# Patient Record
Sex: Female | Born: 1965
Health system: Southern US, Community
[De-identification: ages and names within clinical notes are randomized; demographics above are authoritative.]

## PROBLEM LIST (undated history)

## (undated) DIAGNOSIS — I714 Abdominal aortic aneurysm, without rupture, unspecified: Secondary | ICD-10-CM

## (undated) DIAGNOSIS — K219 Gastro-esophageal reflux disease without esophagitis: Secondary | ICD-10-CM

## (undated) DIAGNOSIS — I10 Essential (primary) hypertension: Secondary | ICD-10-CM

## (undated) DIAGNOSIS — Z5189 Encounter for other specified aftercare: Secondary | ICD-10-CM

## (undated) DIAGNOSIS — K56609 Unspecified intestinal obstruction, unspecified as to partial versus complete obstruction: Secondary | ICD-10-CM

## (undated) HISTORY — DX: Encounter for other specified aftercare: Z51.89

## (undated) HISTORY — PX: NO PAST SURGERIES: SHX2092

## (undated) HISTORY — PX: ABDOMINAL HYSTERECTOMY: SHX81

---

## 2009-12-01 ENCOUNTER — Encounter: Admission: RE | Admit: 2009-12-01 | Discharge: 2009-12-01 | Payer: Self-pay | Admitting: General Practice

## 2011-06-19 ENCOUNTER — Emergency Department (HOSPITAL_COMMUNITY): Payer: No Typology Code available for payment source

## 2011-06-19 ENCOUNTER — Emergency Department (HOSPITAL_COMMUNITY)
Admission: EM | Admit: 2011-06-19 | Discharge: 2011-06-19 | Disposition: A | Discharge status: Home / Self Care | Payer: No Typology Code available for payment source | Attending: Emergency Medicine | Admitting: Emergency Medicine

## 2011-06-19 DIAGNOSIS — M25579 Pain in unspecified ankle and joints of unspecified foot: Secondary | ICD-10-CM | POA: Insufficient documentation

## 2011-06-19 DIAGNOSIS — S9000XA Contusion of unspecified ankle, initial encounter: Secondary | ICD-10-CM | POA: Insufficient documentation

## 2011-06-19 DIAGNOSIS — Z79899 Other long term (current) drug therapy: Secondary | ICD-10-CM | POA: Insufficient documentation

## 2011-06-19 DIAGNOSIS — S93409A Sprain of unspecified ligament of unspecified ankle, initial encounter: Secondary | ICD-10-CM | POA: Insufficient documentation

## 2011-06-19 DIAGNOSIS — I10 Essential (primary) hypertension: Secondary | ICD-10-CM | POA: Insufficient documentation

## 2012-04-19 DIAGNOSIS — R011 Cardiac murmur, unspecified: Secondary | ICD-10-CM | POA: Insufficient documentation

## 2012-04-19 DIAGNOSIS — Z862 Personal history of diseases of the blood and blood-forming organs and certain disorders involving the immune mechanism: Secondary | ICD-10-CM | POA: Insufficient documentation

## 2012-04-22 DIAGNOSIS — E559 Vitamin D deficiency, unspecified: Secondary | ICD-10-CM | POA: Insufficient documentation

## 2012-05-07 ENCOUNTER — Other Ambulatory Visit: Payer: Self-pay | Admitting: Family Medicine

## 2012-05-07 DIAGNOSIS — Z1231 Encounter for screening mammogram for malignant neoplasm of breast: Secondary | ICD-10-CM

## 2012-05-13 ENCOUNTER — Ambulatory Visit
Admission: RE | Admit: 2012-05-13 | Discharge: 2012-05-13 | Disposition: A | Discharge status: Home / Self Care | Payer: 59 | Source: Ambulatory Visit | Attending: Family Medicine | Admitting: Family Medicine

## 2012-05-13 DIAGNOSIS — Z1231 Encounter for screening mammogram for malignant neoplasm of breast: Secondary | ICD-10-CM

## 2013-03-21 ENCOUNTER — Telehealth: Payer: Self-pay | Admitting: Hematology and Oncology

## 2013-03-21 NOTE — Telephone Encounter (Signed)
LVOM FOR PT TO RETURN CALL IN RE NP APPT.  °

## 2013-12-18 ENCOUNTER — Encounter (HOSPITAL_COMMUNITY): Payer: Self-pay | Admitting: Pharmacist

## 2013-12-22 ENCOUNTER — Other Ambulatory Visit: Payer: Self-pay | Admitting: Obstetrics & Gynecology

## 2013-12-30 ENCOUNTER — Other Ambulatory Visit: Payer: Self-pay

## 2013-12-30 ENCOUNTER — Encounter (HOSPITAL_COMMUNITY): Payer: Self-pay

## 2013-12-30 ENCOUNTER — Encounter (HOSPITAL_COMMUNITY)
Admission: RE | Admit: 2013-12-30 | Discharge: 2013-12-30 | Disposition: A | Discharge status: Home / Self Care | Payer: 59 | Source: Ambulatory Visit | Attending: Obstetrics & Gynecology | Admitting: Obstetrics & Gynecology

## 2013-12-30 HISTORY — DX: Essential (primary) hypertension: I10

## 2013-12-30 HISTORY — DX: Gastro-esophageal reflux disease without esophagitis: K21.9

## 2013-12-30 LAB — BASIC METABOLIC PANEL
BUN: 12 mg/dL (ref 6–23)
CHLORIDE: 100 meq/L (ref 96–112)
CO2: 29 meq/L (ref 19–32)
CREATININE: 0.85 mg/dL (ref 0.50–1.10)
Calcium: 10.4 mg/dL (ref 8.4–10.5)
GFR, EST NON AFRICAN AMERICAN: 80 mL/min — AB (ref >90)
GLUCOSE: 96 mg/dL (ref 70–99)
POTASSIUM: 3.3 meq/L — AB (ref 3.7–5.3)
Sodium: 141 mEq/L (ref 137–147)

## 2013-12-30 LAB — CBC
HEMATOCRIT: 34.4 % — AB (ref 36.0–46.0)
HEMOGLOBIN: 11.6 g/dL — AB (ref 12.0–15.0)
MCH: 26.1 pg (ref 26.0–34.0)
MCHC: 33.7 g/dL (ref 30.0–36.0)
MCV: 77.3 fL — ABNORMAL LOW (ref 78.0–100.0)
Platelets: 266 10*3/uL (ref 150–400)
RBC: 4.45 MIL/uL (ref 3.87–5.11)
RDW: 14.6 % (ref 11.5–15.5)
WBC: 4.7 10*3/uL (ref 4.0–10.5)

## 2013-12-30 LAB — TYPE AND SCREEN
ABO/RH(D): A POS
Antibody Screen: NEGATIVE

## 2013-12-30 LAB — ABO/RH: ABO/RH(D): A POS

## 2013-12-30 NOTE — Patient Instructions (Addendum)
[place]  12/30/2013   Your procedure is scheduled on:  01/01/14  Enter through the Main Entrance of [facility] up the phone at the desk and dial [phone].   Call this number if you have problems the morning of surgery: [phone]   Remember:   Do not eat food:After Midnight.  Do not drink clear liquids: After Midnight.  Take these medicines the morning of surgery with A SIP OF WATER: Blood pressure medication and Omeprazole   Do not wear jewelry, make-up or nail polish.  Do not wear lotions, powders, or perfumes. You may wear deodorant.  Do not shave 48 hours prior to surgery.  Do not bring valuables to the hospital.  [facility] is not   responsible for any belongings or valuables brought to the hospital.  Contacts, dentures or bridgework may not be worn into surgery.  Leave suitcase in the car. After surgery it may be brought to your room.  For patients admitted to the hospital, checkout time is 11:00 AM the day of              discharge.   Patients discharged the day of surgery will not be allowed to drive             home.  Name and phone number of your driver: NA  Special Instructions:      Please read over the following fact sheets that you were given:   Surgical Site Infection Prevention

## 2014-01-01 ENCOUNTER — Observation Stay (HOSPITAL_COMMUNITY)
Admission: RE | Admit: 2014-01-01 | Discharge: 2014-01-02 | Disposition: A | Discharge status: Home / Self Care | Payer: 59 | Source: Ambulatory Visit | Attending: Obstetrics & Gynecology | Admitting: Obstetrics & Gynecology

## 2014-01-01 ENCOUNTER — Encounter (HOSPITAL_COMMUNITY): Payer: Self-pay | Admitting: Anesthesiology

## 2014-01-01 ENCOUNTER — Encounter (HOSPITAL_COMMUNITY)
Admission: RE | Disposition: A | Discharge status: Home / Self Care | Payer: Self-pay | Source: Ambulatory Visit | Attending: Obstetrics & Gynecology

## 2014-01-01 ENCOUNTER — Ambulatory Visit (HOSPITAL_COMMUNITY): Payer: 59 | Admitting: Anesthesiology

## 2014-01-01 ENCOUNTER — Encounter (HOSPITAL_COMMUNITY): Payer: 59 | Admitting: Anesthesiology

## 2014-01-01 DIAGNOSIS — D252 Subserosal leiomyoma of uterus: Principal | ICD-10-CM | POA: Insufficient documentation

## 2014-01-01 DIAGNOSIS — N949 Unspecified condition associated with female genital organs and menstrual cycle: Secondary | ICD-10-CM | POA: Insufficient documentation

## 2014-01-01 DIAGNOSIS — D649 Anemia, unspecified: Secondary | ICD-10-CM | POA: Insufficient documentation

## 2014-01-01 DIAGNOSIS — Z90710 Acquired absence of both cervix and uterus: Secondary | ICD-10-CM | POA: Diagnosis not present

## 2014-01-01 DIAGNOSIS — I10 Essential (primary) hypertension: Secondary | ICD-10-CM | POA: Insufficient documentation

## 2014-01-01 DIAGNOSIS — K219 Gastro-esophageal reflux disease without esophagitis: Secondary | ICD-10-CM | POA: Insufficient documentation

## 2014-01-01 DIAGNOSIS — N920 Excessive and frequent menstruation with regular cycle: Secondary | ICD-10-CM | POA: Insufficient documentation

## 2014-01-01 DIAGNOSIS — Z9889 Other specified postprocedural states: Secondary | ICD-10-CM

## 2014-01-01 HISTORY — PX: BILATERAL SALPINGECTOMY: SHX5743

## 2014-01-01 HISTORY — PX: ROBOTIC ASSISTED TOTAL HYSTERECTOMY: SHX6085

## 2014-01-01 SURGERY — ROBOTIC ASSISTED TOTAL HYSTERECTOMY
Anesthesia: General | Site: Abdomen

## 2014-01-01 MED ORDER — DEXAMETHASONE SODIUM PHOSPHATE 10 MG/ML IJ SOLN
INTRAMUSCULAR | Status: DC | PRN
  Administered 2014-01-01: 6 mg via INTRAVENOUS

## 2014-01-01 MED ORDER — PROPOFOL 10 MG/ML IV BOLUS
INTRAVENOUS | Status: DC | PRN
  Administered 2014-01-01: 150 mg via INTRAVENOUS

## 2014-01-01 MED ORDER — CEFAZOLIN SODIUM-DEXTROSE 2-3 GM-% IV SOLR
INTRAVENOUS | Status: AC
  Filled 2014-01-01: qty 50

## 2014-01-01 MED ORDER — LACTATED RINGERS IV SOLN
INTRAVENOUS | Status: DC
  Administered 2014-01-02: 02:00:00 via INTRAVENOUS

## 2014-01-01 MED ORDER — ROCURONIUM BROMIDE 100 MG/10ML IV SOLN
INTRAVENOUS | Status: DC | PRN
  Administered 2014-01-01: 20 mg via INTRAVENOUS
  Administered 2014-01-01: 50 mg via INTRAVENOUS

## 2014-01-01 MED ORDER — BUPIVACAINE HCL (PF) 0.25 % IJ SOLN
INTRAMUSCULAR | Status: AC
  Filled 2014-01-01: qty 30

## 2014-01-01 MED ORDER — ROCURONIUM BROMIDE 100 MG/10ML IV SOLN
INTRAVENOUS | Status: AC
  Filled 2014-01-01: qty 1

## 2014-01-01 MED ORDER — LIDOCAINE HCL (CARDIAC) 20 MG/ML IV SOLN
INTRAVENOUS | Status: DC | PRN
  Administered 2014-01-01: 50 mg via INTRAVENOUS

## 2014-01-01 MED ORDER — CEFAZOLIN SODIUM-DEXTROSE 2-3 GM-% IV SOLR
2.0000 g | INTRAVENOUS | Status: AC
  Administered 2014-01-01: 2 g via INTRAVENOUS

## 2014-01-01 MED ORDER — FENTANYL CITRATE 0.05 MG/ML IJ SOLN
INTRAMUSCULAR | Status: AC
  Filled 2014-01-01: qty 2

## 2014-01-01 MED ORDER — FENTANYL CITRATE 0.05 MG/ML IJ SOLN
25.0000 ug | INTRAMUSCULAR | Status: DC | PRN
  Administered 2014-01-01: 25 ug via INTRAVENOUS

## 2014-01-01 MED ORDER — BUPIVACAINE HCL (PF) 0.25 % IJ SOLN
INTRAMUSCULAR | Status: DC | PRN
  Administered 2014-01-01: 17 mL
  Administered 2014-01-01: 13 mL

## 2014-01-01 MED ORDER — NEOSTIGMINE METHYLSULFATE 10 MG/10ML IV SOLN
INTRAVENOUS | Status: DC | PRN
  Administered 2014-01-01: 3 mg via INTRAVENOUS

## 2014-01-01 MED ORDER — METOCLOPRAMIDE HCL 5 MG/ML IJ SOLN: 10.0000 mg | Freq: Once | INTRAMUSCULAR | Status: DC | PRN

## 2014-01-01 MED ORDER — DEXAMETHASONE SODIUM PHOSPHATE 10 MG/ML IJ SOLN
INTRAMUSCULAR | Status: AC
  Filled 2014-01-01: qty 1

## 2014-01-01 MED ORDER — KETOROLAC TROMETHAMINE 30 MG/ML IJ SOLN
INTRAMUSCULAR | Status: DC | PRN
  Administered 2014-01-01: 30 mg via INTRAVENOUS

## 2014-01-01 MED ORDER — HYDROMORPHONE HCL PF 1 MG/ML IJ SOLN
1.0000 mg | INTRAMUSCULAR | Status: DC | PRN
  Administered 2014-01-01: 1 mg via INTRAVENOUS
  Filled 2014-01-01: qty 1

## 2014-01-01 MED ORDER — LIDOCAINE HCL (CARDIAC) 20 MG/ML IV SOLN
INTRAVENOUS | Status: AC
  Filled 2014-01-01: qty 5

## 2014-01-01 MED ORDER — FENTANYL CITRATE 0.05 MG/ML IJ SOLN
INTRAMUSCULAR | Status: AC
  Administered 2014-01-01: 25 ug via INTRAVENOUS
  Filled 2014-01-01: qty 2

## 2014-01-01 MED ORDER — MEPERIDINE HCL 25 MG/ML IJ SOLN: 6.2500 mg | INTRAMUSCULAR | Status: DC | PRN

## 2014-01-01 MED ORDER — MIDAZOLAM HCL 2 MG/2ML IJ SOLN
INTRAMUSCULAR | Status: DC | PRN
  Administered 2014-01-01: 2 mg via INTRAVENOUS

## 2014-01-01 MED ORDER — LACTATED RINGERS IV SOLN
INTRAVENOUS | Status: DC
Start: 2014-01-01 — End: 2014-01-01
  Administered 2014-01-01 (×3): via INTRAVENOUS

## 2014-01-01 MED ORDER — IBUPROFEN 600 MG PO TABS
600.0000 mg | ORAL_TABLET | Freq: Four times a day (QID) | ORAL | Status: DC | PRN
  Administered 2014-01-02: 600 mg via ORAL
  Filled 2014-01-01: qty 1

## 2014-01-01 MED ORDER — PANTOPRAZOLE SODIUM 40 MG PO TBEC
40.0000 mg | DELAYED_RELEASE_TABLET | Freq: Every day | ORAL | Status: DC
  Administered 2014-01-02: 40 mg via ORAL
  Filled 2014-01-01: qty 1

## 2014-01-01 MED ORDER — PROPOFOL 10 MG/ML IV EMUL
INTRAVENOUS | Status: AC
  Filled 2014-01-01: qty 20

## 2014-01-01 MED ORDER — MIDAZOLAM HCL 2 MG/2ML IJ SOLN
INTRAMUSCULAR | Status: AC
  Filled 2014-01-01: qty 2

## 2014-01-01 MED ORDER — FENTANYL CITRATE 0.05 MG/ML IJ SOLN
INTRAMUSCULAR | Status: DC | PRN
  Administered 2014-01-01: 100 ug via INTRAVENOUS
  Administered 2014-01-01: 50 ug via INTRAVENOUS
  Administered 2014-01-01 (×3): 100 ug via INTRAVENOUS

## 2014-01-01 MED ORDER — METOPROLOL SUCCINATE ER 100 MG PO TB24
100.0000 mg | ORAL_TABLET | Freq: Every day | ORAL | Status: DC
  Administered 2014-01-02: 100 mg via ORAL
  Filled 2014-01-01: qty 1

## 2014-01-01 MED ORDER — HYDROMORPHONE HCL PF 1 MG/ML IJ SOLN
INTRAMUSCULAR | Status: AC
  Filled 2014-01-01: qty 1

## 2014-01-01 MED ORDER — OXYCODONE-ACETAMINOPHEN 5-325 MG PO TABS
1.0000 | ORAL_TABLET | ORAL | Status: DC | PRN
  Administered 2014-01-02 (×2): 1 via ORAL
  Filled 2014-01-01 (×2): qty 1

## 2014-01-01 MED ORDER — HYDROMORPHONE HCL PF 1 MG/ML IJ SOLN
INTRAMUSCULAR | Status: DC | PRN
Start: 2014-01-01 — End: 2014-01-01
  Administered 2014-01-01: 1 mg via INTRAVENOUS

## 2014-01-01 MED ORDER — LACTATED RINGERS IR SOLN
Status: DC | PRN
  Administered 2014-01-01: 3000 mL

## 2014-01-01 MED ORDER — HYDROCHLOROTHIAZIDE 25 MG PO TABS
50.0000 mg | ORAL_TABLET | Freq: Every day | ORAL | Status: DC
  Administered 2014-01-02: 50 mg via ORAL
  Filled 2014-01-01: qty 2

## 2014-01-01 MED ORDER — ONDANSETRON HCL 4 MG/2ML IJ SOLN
INTRAMUSCULAR | Status: DC | PRN
  Administered 2014-01-01: 4 mg via INTRAVENOUS

## 2014-01-01 MED ORDER — FENTANYL CITRATE 0.05 MG/ML IJ SOLN
INTRAMUSCULAR | Status: AC
  Filled 2014-01-01: qty 5

## 2014-01-01 MED ORDER — GLYCOPYRROLATE 0.2 MG/ML IJ SOLN
INTRAMUSCULAR | Status: DC | PRN
  Administered 2014-01-01: 0.4 mg via INTRAVENOUS

## 2014-01-01 SURGICAL SUPPLY — 61 items
BAG URINE DRAINAGE (UROLOGICAL SUPPLIES) ×3 IMPLANT
BARRIER ADHS 3X4 INTERCEED (GAUZE/BANDAGES/DRESSINGS) IMPLANT
CATH FOLEY 3WAY  5CC 16FR (CATHETERS) ×1
CATH FOLEY 3WAY 5CC 16FR (CATHETERS) ×2 IMPLANT
CLOTH BEACON ORANGE TIMEOUT ST (SAFETY) ×3 IMPLANT
CONT PATH 16OZ SNAP LID 3702 (MISCELLANEOUS) ×3 IMPLANT
COVER MAYO STAND STRL (DRAPES) ×3 IMPLANT
COVER TABLE BACK 60X90 (DRAPES) ×6 IMPLANT
COVER TIP SHEARS 8 DVNC (MISCELLANEOUS) ×2 IMPLANT
COVER TIP SHEARS 8MM DA VINCI (MISCELLANEOUS) ×1
DECANTER SPIKE VIAL GLASS SM (MISCELLANEOUS) ×3 IMPLANT
DERMABOND ADVANCED (GAUZE/BANDAGES/DRESSINGS) ×1
DERMABOND ADVANCED .7 DNX12 (GAUZE/BANDAGES/DRESSINGS) ×2 IMPLANT
DRAPE HUG U DISPOSABLE (DRAPE) ×3 IMPLANT
DRAPE LG THREE QUARTER DISP (DRAPES) ×6 IMPLANT
DRAPE WARM FLUID 44X44 (DRAPE) ×3 IMPLANT
ELECT REM PT RETURN 9FT ADLT (ELECTROSURGICAL) ×3
ELECTRODE REM PT RTRN 9FT ADLT (ELECTROSURGICAL) ×2 IMPLANT
EVACUATOR SMOKE 8.L (FILTER) ×3 IMPLANT
GAUZE VASELINE 3X9 (GAUZE/BANDAGES/DRESSINGS) IMPLANT
GLOVE BIO SURGEON STRL SZ 6.5 (GLOVE) ×6 IMPLANT
GLOVE BIO SURGEON STRL SZ7 (GLOVE) ×6 IMPLANT
GLOVE BIOGEL PI IND STRL 7.0 (GLOVE) ×6 IMPLANT
GLOVE BIOGEL PI INDICATOR 7.0 (GLOVE) ×3
GOWN STRL REUS W/TWL LRG LVL3 (GOWN DISPOSABLE) ×27 IMPLANT
IV STOPCOCK 4 WAY 40  W/Y SET (IV SOLUTION)
IV STOPCOCK 4 WAY 40 W/Y SET (IV SOLUTION) IMPLANT
KIT ACCESSORY DA VINCI DISP (KITS) ×1
KIT ACCESSORY DVNC DISP (KITS) ×2 IMPLANT
LEGGING LITHOTOMY PAIR STRL (DRAPES) ×3 IMPLANT
NEEDLE HYPO 22GX1.5 SAFETY (NEEDLE) IMPLANT
OCCLUDER COLPOPNEUMO (BALLOONS) ×3 IMPLANT
PACK LAVH (CUSTOM PROCEDURE TRAY) ×3 IMPLANT
PAD PREP 24X48 CUFFED NSTRL (MISCELLANEOUS) ×6 IMPLANT
PLUG CATH AND CAP STER (CATHETERS) ×3 IMPLANT
PROTECTOR NERVE ULNAR (MISCELLANEOUS) ×6 IMPLANT
SET CYSTO W/LG BORE CLAMP LF (SET/KITS/TRAYS/PACK) IMPLANT
SET IRRIG TUBING LAPAROSCOPIC (IRRIGATION / IRRIGATOR) ×3 IMPLANT
SOLUTION ELECTROLUBE (MISCELLANEOUS) ×3 IMPLANT
SUT VIC AB 0 CT1 27 (SUTURE) ×2
SUT VIC AB 0 CT1 27XBRD ANBCTR (SUTURE) ×4 IMPLANT
SUT VIC AB 4-0 PS2 27 (SUTURE) ×6 IMPLANT
SUT VICRYL 0 UR6 27IN ABS (SUTURE) ×6 IMPLANT
SUT VLOC 180 0 9IN  GS21 (SUTURE) ×1
SUT VLOC 180 0 9IN GS21 (SUTURE) ×2 IMPLANT
SYR 50ML LL SCALE MARK (SYRINGE) ×3 IMPLANT
SYSTEM CONVERTIBLE TROCAR (TROCAR) ×3 IMPLANT
TIP RUMI ORANGE 6.7MMX12CM (TIP) IMPLANT
TIP UTERINE 5.1X6CM LAV DISP (MISCELLANEOUS) IMPLANT
TIP UTERINE 6.7X10CM GRN DISP (MISCELLANEOUS) ×3 IMPLANT
TIP UTERINE 6.7X6CM WHT DISP (MISCELLANEOUS) IMPLANT
TIP UTERINE 6.7X8CM BLUE DISP (MISCELLANEOUS) IMPLANT
TOWEL OR 17X24 6PK STRL BLUE (TOWEL DISPOSABLE) ×9 IMPLANT
TROCAR 12M 150ML BLUNT (TROCAR) IMPLANT
TROCAR DISP BLADELESS 8 DVNC (TROCAR) ×2 IMPLANT
TROCAR DISP BLADELESS 8MM (TROCAR) ×1
TROCAR XCEL 12X100 BLDLESS (ENDOMECHANICALS) ×3 IMPLANT
TROCAR XCEL NON-BLD 5MMX100MML (ENDOMECHANICALS) ×3 IMPLANT
TUBING FILTER THERMOFLATOR (ELECTROSURGICAL) ×3 IMPLANT
WARMER LAPAROSCOPE (MISCELLANEOUS) ×3 IMPLANT
WATER STERILE IRR 1000ML POUR (IV SOLUTION) ×9 IMPLANT

## 2014-01-01 NOTE — Op Note (Signed)
01/01/2014  3:42 PM  PATIENT:  Connie Davis  48 y.o. female  PRE-OPERATIVE DIAGNOSIS:  Uterine Myoma, Pelvic Pain, Menorrhagia  POST-OPERATIVE DIAGNOSIS:  Uterine Myoma, Pelvic Pain, Menorrhagia  PROCEDURE:  Procedure(s): ROBOTIC ASSISTED TOTAL HYSTERECTOMY BILATERAL SALPINGECTOMY  SURGEON:  Surgeon(s): Princess Bruins, MD Elveria Royals, MD  ASSISTANTS: Elveria Royals   ANESTHESIA:   general  PROCEDURE:  Under general anesthesia, the patient is in lithotomy position. She is prepped with ChloraPrep on the abdomen and with Betadine on the suprapubic, vulvar and vaginal areas. She is draped as usual.  The vaginal exam reveals an anteverted uterus irregular and increased in volume.  No adnexal mass are felt.  The weighted speculum is inserted in the vagina and the anterior lip of the cervix is grasped with a tenaculum. Hysterometry is at 10 cm. We used the 10 roomy with the medium Ko-ring.  Those are put in place without difficulty. The tenaculum and weighted speculum were removed. The Foley is inserted in the bladder.  We then go to the abdomen.  The subcutaneous tissue was infiltrated with Marcaine one quarter plain at the supraumbilical area. A 1.5 cm incision is made with a scalpel at that level. We opened the aponeurosis under direct vision with Mayo scissors. The parietal peritoneum is opened with Mayo scissors as well under direct vision. A pursestring stitch of Vicryl 0 is done on the aponeurosis. The Sheryle Hail is inserted at that level and a pneumoperitoneum is created with CO2. The camera is inserted to proceed with inspection of the abdominopelvic cavities.  The uterus is increased in volume with multiple fibroids.  The overall volume was 419 cc by ultrasound.  Both ovaries are normal in size and appearance. Both tubes are normal in size and appearance. No lesion is present in the pelvis otherwise.  The appendix and the liver are normal in appearance.  Pictures are taken of all those  structures.  We used a semicircular configuration for port placement.  With 2 robotic ports on the right and one robotic port on the lower left and the assistant port, 5 mm, on the upper left.  The robot his docked from the right side. The robotic instruments are inserted under direct vision with the Endo Shears scissors and the first arm, the PK in the second arm and the fenestrated clamp in the third arm.   We go to the console. Both ureters are visualized and normal anatomic position.  We starts on the left side.  The left round ligament was cauterized and sectioned. We then cauterized and sectioned the left mesosalpinx. We cauterized and sectioned the left utero-ovarian ligament.  We descend along the left side of the uterus and stopped just before the left uterine artery.  The visceral peritoneum is opened anteriorly and the bladder is lowered past the [place].  We proceeded exactly the same way on the right side. We then cauterized the right uterine artery and sectioned it.  We then cauterized the left uterine artery and sectioned.  The vaginal vault was then opened on each side and posteriorly above the top of the [place] ring with the [employer]. We kept the uterus attached anteriorly as we sectioned it into 2 pieces to facilitate the passage vaginally.  We then completed the opening of the vaginal vault anteriorly. Both sections of the uterus passed easily vaginally and the specimens were sent to pathology.  We then irrigated and suctioned the pelvic cavity. Hemostasis was completed with the PK  where needed.  Hemostasis was adequate.  We switched instruments to the cutting needle driver in the right arm, the long tip in the left arm and the PK in the third arm.  A V. Lock 0, 9 inches, was used to close the vaginal vault.  We started at the right angle and finished at the left angle, with a few bites coming back.  Hemostasis was adequate at all levels. The robotic instruments were removed. The robot  was undocked. We went to laparoscopy time. The needle was removed from the abdomen. We irrigated and suctioned the abdominopelvic cavities. Hemostasis was confirmed once more.  We removed laparoscopy instruments. The ports were removed under direct vision. The CO2 was evacuated.  The pursestring stitch was attached at the supraumbilical incision.  We then used a Vicryl 2-0 to close the adipose tissue at that level.  All incisions were closed with a subcuticular stitch of Vicryl 3-0. Dermabond was added on all incisions.  The vaginal instruments were removed.  Hemostasis was adequate at that level as well.  The patient was brought to recovery room in good and stable status.  ESTIMATED BLOOD LOSS:  100 cc   Intake/Output Summary (Last 24 hours) at 01/01/14 1542 Last data filed at 01/01/14 1525  Gross per 24 hour  Intake   1000 ml  Output    400 ml  Net    600 ml     BLOOD ADMINISTERED:none   LOCAL MEDICATIONS USED:  MARCAINE     SPECIMEN:  Source of Specimen:  Uterus with cervix and tubes  DISPOSITION OF SPECIMEN:  PATHOLOGY  COUNTS:  YES  PLAN OF CARE: Transfer to PACU  Princess Bruins MD  01/01/2014 at 3:42 pm

## 2014-01-01 NOTE — Anesthesia Postprocedure Evaluation (Signed)
  Anesthesia Post-op Note  Patient: Connie Davis  Procedure(s) Performed: Procedure(s): ROBOTIC ASSISTED TOTAL HYSTERECTOMY (N/A) BILATERAL SALPINGECTOMY (Bilateral) Last Vitals:  Filed Vitals:   01/01/14 1745  BP: 143/78  Pulse: 56  Temp:   Resp: 12    Patient is awake and responsive. Pain and nausea are reasonably well controlled. Vital signs are stable and clinically acceptable. Oxygen saturation is clinically acceptable. There are no apparent anesthetic complications at this time. Patient is ready for discharge.

## 2014-01-01 NOTE — Anesthesia Preprocedure Evaluation (Addendum)
Anesthesia Evaluation  Patient identified by MRN, date of birth, ID band Patient awake    Reviewed: Allergy & Precautions, H&P , NPO status , Patient's Chart, lab work & pertinent test results  Airway Mallampati: III TM Distance: >3 FB Neck ROM: Full  Mouth opening: Limited Mouth Opening  Dental no notable dental hx. (+) Teeth Intact   Pulmonary neg pulmonary ROS,  breath sounds clear to auscultation  Pulmonary exam normal       Cardiovascular hypertension, Pt. on medications and Pt. on home beta blockers Rhythm:Regular Rate:Normal     Neuro/Psych negative neurological ROS  negative psych ROS   GI/Hepatic Neg liver ROS, GERD-  Medicated and Controlled,  Endo/Other  negative endocrine ROS  Renal/GU negative Renal ROS  negative genitourinary   Musculoskeletal negative musculoskeletal ROS (+)   Abdominal   Peds  Hematology  (+) anemia ,   Anesthesia Other Findings   Reproductive/Obstetrics Uterine fibroids Menorrhagia Pelvic pain                          Anesthesia Physical Anesthesia Plan  ASA: II  Anesthesia Plan: General   Post-op Pain Management:    Induction: Intravenous and Cricoid pressure planned  Airway Management Planned: Oral ETT  Additional Equipment:   Intra-op Plan:   Post-operative Plan: Extubation in OR  Informed Consent: I have reviewed the patients History and Physical, chart, labs and discussed the procedure including the risks, benefits and alternatives for the proposed anesthesia with the patient or authorized representative who has indicated his/her understanding and acceptance.   Dental advisory given  Plan Discussed with: CRNA, Anesthesiologist and Surgeon  Anesthesia Plan Comments:         Anesthesia Quick Evaluation

## 2014-01-01 NOTE — H&P (Signed)
Connie Davis is an 48 y.o. female G2P2  RP:  Pelvic pain/uterine myomas for Robotic TLH/Bilat Salpingectomies.  Pertinent Gynecological History: Menses: flow is moderate Contraception: abstinence Blood transfusions: none Sexually transmitted diseases: no past history Last mammogram: normal Last pap: normal OB History: G2P2   Menstrual History:  Patient's last menstrual period was 12/22/2013.    Past Medical History  Diagnosis Date  . Hypertension   . GERD (gastroesophageal reflux disease)     Past Surgical History  Procedure Laterality Date  . No past surgeries      No family history on file.  Social History:  reports that she has never smoked. She does not have any smokeless tobacco history on file. She reports that she does not drink alcohol or use illicit drugs.  Allergies: No Known Allergies  Prescriptions prior to admission  Medication Sig Dispense Refill  . acetaminophen (TYLENOL) 500 MG tablet Take 1,000 mg by mouth every 6 (six) hours as needed for mild pain.      Marland Kitchen aspirin EC 81 MG tablet Take 81 mg by mouth daily.      . ferrous sulfate 325 (65 FE) MG tablet Take 325 mg by mouth 2 (two) times daily with a meal.      . hydrochlorothiazide (HYDRODIURIL) 50 MG tablet Take 50 mg by mouth daily.      . metoprolol succinate (TOPROL-XL) 100 MG 24 hr tablet Take 100 mg by mouth daily. Take with or immediately following a meal.      . omeprazole (PRILOSEC) 20 MG capsule Take 20 mg by mouth daily as needed. Reflux        ROS  Blood pressure 156/104, pulse 69, temperature 98.1 F (36.7 C), temperature source Oral, resp. rate 18, last menstrual period 12/22/2013, SpO2 97.00%. Physical Exam  Pelvic US 10/2013 Uterine volume 419 cc with myomas.  Thin endometrium 4.2 mm.  Ovaries wnl x2.  No results found for this or any previous visit (from the past 24 hour(s)).  No results found.  Assessment/Plan: Pelvic pain with uterine myomas for Robotic TLH/Bilat  Salpingectomies.  Surgery and risks reviewed.  Marie-Lyne Pattricia Weiher 01/01/2014, 11:53 AM

## 2014-01-01 NOTE — Transfer of Care (Signed)
Immediate Anesthesia Transfer of Care Note  Patient: Connie Davis  Procedure(s) Performed: Procedure(s): ROBOTIC ASSISTED TOTAL HYSTERECTOMY (N/A) BILATERAL SALPINGECTOMY (Bilateral)  Patient Location: PACU  Anesthesia Type:General  Level of Consciousness: awake, alert  and oriented  Airway & Oxygen Therapy: Patient Spontanous Breathing and Patient connected to nasal cannula oxygen  Post-op Assessment: Report given to PACU RN and Post -op Vital signs reviewed and stable  Post vital signs: Reviewed and stable  Complications: No apparent anesthesia complications

## 2014-01-01 NOTE — Anesthesia Procedure Notes (Signed)
Procedure Name: Intubation Date/Time: 01/01/2014 1:18 PM Performed by: Jonna Munro Pre-anesthesia Checklist: Patient being monitored, Suction available, Emergency Drugs available, Patient identified and Timeout performed Patient Re-evaluated:Patient Re-evaluated prior to inductionOxygen Delivery Method: Circle system utilized Preoxygenation: Pre-oxygenation with 100% oxygen Intubation Type: IV induction Ventilation: Mask ventilation without difficulty Laryngoscope Size: Mac and 3 Grade View: Grade I Tube type: Oral Tube size: 7.0 mm Number of attempts: 1 Airway Equipment and Method: Stylet Placement Confirmation: ETT inserted through vocal cords under direct vision,  positive ETCO2 and breath sounds checked- equal and bilateral Secured at: 20 cm Tube secured with: Tape Dental Injury: Teeth and Oropharynx as per pre-operative assessment

## 2014-01-02 ENCOUNTER — Encounter (HOSPITAL_COMMUNITY): Payer: Self-pay | Admitting: Obstetrics & Gynecology

## 2014-01-02 LAB — CBC
HCT: 36.7 % (ref 36.0–46.0)
HEMOGLOBIN: 12.2 g/dL (ref 12.0–15.0)
MCH: 25.6 pg — AB (ref 26.0–34.0)
MCHC: 33.2 g/dL (ref 30.0–36.0)
MCV: 76.9 fL — AB (ref 78.0–100.0)
PLATELETS: 271 10*3/uL (ref 150–400)
RBC: 4.77 MIL/uL (ref 3.87–5.11)
RDW: 14.4 % (ref 11.5–15.5)
WBC: 9.3 10*3/uL (ref 4.0–10.5)

## 2014-01-02 MED ORDER — HYDROCODONE-IBUPROFEN 5-200 MG PO TABS
1.0000 | ORAL_TABLET | Freq: Three times a day (TID) | ORAL | Status: DC | PRN
Start: 2014-01-02 — End: 2016-04-02

## 2014-01-02 NOTE — Discharge Summary (Signed)
  Physician Discharge Summary  Patient ID: Connie Davis MRN: [id number] DOB/AGE: 47-Nov-1967 47 y.o.  Admit date: 01/01/2014 Discharge date: 01/02/2014  Admission Diagnoses: Uterine Myoma, Pelvic Pain, Menorrhagia  Discharge Diagnoses: Uterine Myoma, Pelvic Pain, Menorrhagia        Principal Problem:   S/P hysterectomy- Davinci Active Problems:   Postoperative state   Discharged Condition: good  Hospital Course: good  Consults: None  Treatments: surgery: Robotic total hysterectomy with bilateral salpingectomies  Disposition: 01-Home or Self Care     Medication List         acetaminophen 500 MG tablet  Commonly known as:  TYLENOL  Take 1,000 mg by mouth every 6 (six) hours as needed for mild pain.     aspirin EC 81 MG tablet  Take 81 mg by mouth daily.     ferrous sulfate 325 (65 FE) MG tablet  Take 325 mg by mouth 2 (two) times daily with a meal.     hydrochlorothiazide 50 MG tablet  Commonly known as:  HYDRODIURIL  Take 50 mg by mouth daily.     hydrocodone-ibuprofen 5-200 MG per tablet  Commonly known as:  VICOPROFEN  Take 1 tablet by mouth every 8 (eight) hours as needed for pain.     metoprolol succinate 100 MG 24 hr tablet  Commonly known as:  TOPROL-XL  Take 100 mg by mouth daily. Take with or immediately following a meal.     omeprazole 20 MG capsule  Commonly known as:  PRILOSEC  Take 20 mg by mouth daily as needed. Reflux           Follow-up Information   Follow up with Elis Sauber,MARIE-LYNE, MD In 3 weeks.   Specialty:  Obstetrics and Gynecology   Contact information:   [address] [place] [id number] [phone]       Signed: Princess Bruins, MD 01/02/2014, 1:01 PM

## 2014-01-02 NOTE — Anesthesia Postprocedure Evaluation (Signed)
  Anesthesia Post-op Note  Anesthesia Post Note  Patient: Connie Davis  Procedure(s) Performed: Procedure(s) (LRB): ROBOTIC ASSISTED TOTAL HYSTERECTOMY (N/A) BILATERAL SALPINGECTOMY (Bilateral)  Anesthesia type: General  Patient location: Women's Unit  Post pain: Pain level controlled  Post assessment: Post-op Vital signs reviewed  Last Vitals:  Filed Vitals:   01/02/14 0512  BP: 141/74  Pulse: 68  Temp: 36.9 C  Resp: 18    Post vital signs: Reviewed  Level of consciousness: sedated  Complications: No apparent anesthesia complications

## 2014-01-02 NOTE — Progress Notes (Signed)
Pt  Ambulated out  Teaching complete pt will call MD to see if she needs to  Take  ASA  Later  Than indicated

## 2014-01-02 NOTE — Addendum Note (Signed)
Addendum created 01/02/14 0758 by Billie Lade, CRNA   Modules edited: Notes Section   Notes Section:  File: [id number]

## 2014-01-02 NOTE — Progress Notes (Signed)
1 Day Post-Op Procedure(s) (LRB): ROBOTIC ASSISTED TOTAL HYSTERECTOMY (N/A) BILATERAL SALPINGECTOMY (Bilateral)  Subjective: Patient reports that pain is well managed.  Tolerating normal diet as tolerated  diet without difficulty. No nausea / vomiting.  Ambulating and voiding.  Objective: BP 122/73  Pulse 80  Temp(Src) 98.5 F (36.9 C) (Oral)  Resp 18  Ht 5\' 7"  (1.702 m)  Wt 63.05 kg (139 lb)  BMI 21.77 kg/m2  SpO2 100%  LMP 12/22/2013 Lungs: clear Heart: normal rate and rhythm Abdomen:soft and appropriately tender Extremities: Homans sign is negative, no sign of DVT Incision: healing well  Post op Hb 12.2  Assessment: s/p Procedure(s): ROBOTIC ASSISTED TOTAL HYSTERECTOMY BILATERAL SALPINGECTOMY: progressing well  Plan: Discharge home  LOS: 1 day    Connie Davis 01/02/2014, 12:57 PM

## 2014-01-02 NOTE — Discharge Instructions (Signed)
Robotic laparoscopically Assisted Vaginal Hysterectomy, Care After Refer to this sheet in the next few weeks. These instructions provide you with information on caring for yourself after your procedure. Your health care provider may also give you more specific instructions. Your treatment has been planned according to current medical practices, but problems sometimes occur. Call your health care provider if you have any problems or questions after your procedure. WHAT TO EXPECT AFTER THE PROCEDURE After your procedure, it is typical to have the following:  Abdominal pain. You will be given pain medicine to control it.  Sore throat from the breathing tube that was inserted during surgery. HOME CARE INSTRUCTIONS  Only take over-the-counter or prescription medicines for pain, discomfort, or fever as directed by your health care provider.  Do not take aspirin. It can cause bleeding.  Do not drive when taking pain medicine.  Follow your health care provider's advice regarding diet, exercise, lifting, driving, and general activities.  Resume your usual diet as directed and allowed.  Get plenty of rest and sleep.  Do not douche, use tampons, or have sexual intercourse for at least 6 weeks, or until your health care provider gives you permission.  Change your bandages (dressings) as directed by your health care provider.  Monitor your temperature and notify your health care provider of a fever.  Take showers instead of baths for 2 3 weeks.  Do not drink alcohol until your health care provider gives you permission.  If you develop constipation, you may take a mild laxative with your health care provider's permission. Bran foods may help with constipation problems. Drinking enough fluids to keep your urine clear or pale yellow may help as well.  Try to have someone home with you for 1 2 weeks to help around the house.  Keep all of your follow-up appointments as directed by your health care  provider. SEEK MEDICAL CARE IF:   You have swelling, redness, or increasing pain around your incision sites.  You have pus coming from your incision.  You notice a bad smell coming from your incision.  Your incision breaks open.  You feel dizzy or lightheaded.  You have pain or bleeding when you urinate.  You have persistent diarrhea.  You have persistent nausea and vomiting.  You have abnormal vaginal discharge.  You have a rash.  You have any type of abnormal reaction or develop an allergy to your medicine.  You have poor pain control with your prescribed medicine. SEEK IMMEDIATE MEDICAL CARE IF:   You have a fever.  You have severe abdominal pain.  You have chest pain.  You have shortness of breath.  You faint.  You have pain, swelling, or redness in your leg.  You have heavy vaginal bleeding with blood clots. Document Released: 08/03/2011 Document Revised: 04/16/2013 Document Reviewed: 02/27/2013 [facility] Patient Information 2014 [place], [place].

## 2014-08-05 ENCOUNTER — Other Ambulatory Visit: Payer: Self-pay

## 2014-08-05 DIAGNOSIS — Z1231 Encounter for screening mammogram for malignant neoplasm of breast: Secondary | ICD-10-CM

## 2014-08-27 ENCOUNTER — Ambulatory Visit
Admission: RE | Admit: 2014-08-27 | Discharge: 2014-08-27 | Disposition: A | Discharge status: Home / Self Care | Payer: 59 | Source: Ambulatory Visit

## 2014-08-27 ENCOUNTER — Encounter (INDEPENDENT_AMBULATORY_CARE_PROVIDER_SITE_OTHER): Payer: Self-pay

## 2014-08-27 ENCOUNTER — Other Ambulatory Visit: Payer: Self-pay

## 2014-08-27 DIAGNOSIS — Z1231 Encounter for screening mammogram for malignant neoplasm of breast: Secondary | ICD-10-CM

## 2015-06-28 ENCOUNTER — Other Ambulatory Visit: Payer: Self-pay

## 2015-06-28 DIAGNOSIS — Z1231 Encounter for screening mammogram for malignant neoplasm of breast: Secondary | ICD-10-CM

## 2015-08-29 HISTORY — PX: ABDOMINAL AORTIC ANEURYSM REPAIR: SUR1152

## 2016-02-11 ENCOUNTER — Encounter (HOSPITAL_COMMUNITY): Payer: Self-pay | Admitting: Nurse Practitioner

## 2016-02-11 ENCOUNTER — Emergency Department (HOSPITAL_COMMUNITY): Payer: BLUE CROSS/BLUE SHIELD

## 2016-02-11 ENCOUNTER — Ambulatory Visit (HOSPITAL_COMMUNITY)
Admission: EM | Admit: 2016-02-11 | Discharge: 2016-02-11 | Disposition: A | Discharge status: Nursing Home (Includes ICF) | Payer: BLUE CROSS/BLUE SHIELD | Attending: Family Medicine | Admitting: Family Medicine

## 2016-02-11 ENCOUNTER — Emergency Department (HOSPITAL_COMMUNITY)
Admission: EM | Admit: 2016-02-11 | Discharge: 2016-02-12 | Disposition: A | Discharge status: Home / Self Care | Payer: BLUE CROSS/BLUE SHIELD | Attending: Emergency Medicine | Admitting: Emergency Medicine

## 2016-02-11 ENCOUNTER — Encounter (HOSPITAL_COMMUNITY): Payer: Self-pay

## 2016-02-11 DIAGNOSIS — R0789 Other chest pain: Secondary | ICD-10-CM

## 2016-02-11 DIAGNOSIS — I10 Essential (primary) hypertension: Secondary | ICD-10-CM | POA: Diagnosis not present

## 2016-02-11 DIAGNOSIS — R748 Abnormal levels of other serum enzymes: Secondary | ICD-10-CM | POA: Diagnosis not present

## 2016-02-11 DIAGNOSIS — R1012 Left upper quadrant pain: Secondary | ICD-10-CM | POA: Diagnosis not present

## 2016-02-11 DIAGNOSIS — K210 Gastro-esophageal reflux disease with esophagitis: Secondary | ICD-10-CM | POA: Insufficient documentation

## 2016-02-11 DIAGNOSIS — R071 Chest pain on breathing: Secondary | ICD-10-CM | POA: Diagnosis not present

## 2016-02-11 DIAGNOSIS — K219 Gastro-esophageal reflux disease without esophagitis: Secondary | ICD-10-CM

## 2016-02-11 DIAGNOSIS — Z7982 Long term (current) use of aspirin: Secondary | ICD-10-CM | POA: Insufficient documentation

## 2016-02-11 DIAGNOSIS — R109 Unspecified abdominal pain: Secondary | ICD-10-CM | POA: Diagnosis present

## 2016-02-11 DIAGNOSIS — R7989 Other specified abnormal findings of blood chemistry: Secondary | ICD-10-CM

## 2016-02-11 LAB — URINALYSIS, ROUTINE W REFLEX MICROSCOPIC
Bilirubin Urine: NEGATIVE
GLUCOSE, UA: NEGATIVE mg/dL
HGB URINE DIPSTICK: NEGATIVE
Ketones, ur: NEGATIVE mg/dL
Leukocytes, UA: NEGATIVE
Nitrite: NEGATIVE
PROTEIN: NEGATIVE mg/dL
Specific Gravity, Urine: 1.009 (ref 1.005–1.030)
pH: 6.5 (ref 5.0–8.0)

## 2016-02-11 LAB — COMPREHENSIVE METABOLIC PANEL
ALBUMIN: 3.3 g/dL — AB (ref 3.5–5.0)
ALT: 13 U/L — AB (ref 14–54)
AST: 20 U/L (ref 15–41)
Alkaline Phosphatase: 68 U/L (ref 38–126)
Anion gap: 6 (ref 5–15)
BUN: 18 mg/dL (ref 6–20)
CHLORIDE: 106 mmol/L (ref 101–111)
CO2: 26 mmol/L (ref 22–32)
CREATININE: 1.69 mg/dL — AB (ref 0.44–1.00)
Calcium: 9.9 mg/dL (ref 8.9–10.3)
GFR calc non Af Amer: 34 mL/min — ABNORMAL LOW (ref >60)
GFR, EST AFRICAN AMERICAN: 40 mL/min — AB (ref >60)
GLUCOSE: 102 mg/dL — AB (ref 65–99)
Potassium: 3.5 mmol/L (ref 3.5–5.1)
SODIUM: 138 mmol/L (ref 135–145)
Total Bilirubin: 0.2 mg/dL — ABNORMAL LOW (ref 0.3–1.2)
Total Protein: 7.7 g/dL (ref 6.5–8.1)

## 2016-02-11 LAB — CBC
HCT: 36 % (ref 36.0–46.0)
Hemoglobin: 11.7 g/dL — ABNORMAL LOW (ref 12.0–15.0)
MCH: 24.6 pg — AB (ref 26.0–34.0)
MCHC: 32.5 g/dL (ref 30.0–36.0)
MCV: 75.8 fL — AB (ref 78.0–100.0)
PLATELETS: 173 10*3/uL (ref 150–400)
RBC: 4.75 MIL/uL (ref 3.87–5.11)
RDW: 14.8 % (ref 11.5–15.5)
WBC: 5.4 10*3/uL (ref 4.0–10.5)

## 2016-02-11 LAB — D-DIMER, QUANTITATIVE: D-Dimer, Quant: 2.46 ug/mL-FEU — ABNORMAL HIGH (ref 0.00–0.50)

## 2016-02-11 LAB — TROPONIN I: Troponin I: <0.03 ng/mL (ref <0.031)

## 2016-02-11 LAB — LIPASE, BLOOD: Lipase: 30 U/L (ref 11–51)

## 2016-02-11 MED ORDER — ACETAMINOPHEN 500 MG PO TABS: 500.0000 mg | ORAL_TABLET | Freq: Four times a day (QID) | ORAL | Status: DC | PRN

## 2016-02-11 MED ORDER — LOSARTAN POTASSIUM 25 MG PO TABS
25.0000 mg | ORAL_TABLET | Freq: Every day | ORAL | Status: DC
  Administered 2016-02-11: 25 mg via ORAL
  Filled 2016-02-11: qty 1

## 2016-02-11 MED ORDER — SUCRALFATE 1 GM/10ML PO SUSP: 1.0000 g | Freq: Three times a day (TID) | ORAL | Status: DC

## 2016-02-11 MED ORDER — PANTOPRAZOLE SODIUM 20 MG PO TBEC: 20.0000 mg | DELAYED_RELEASE_TABLET | Freq: Every day | ORAL | Status: DC

## 2016-02-11 MED ORDER — GI COCKTAIL ~~LOC~~
30.0000 mL | Freq: Once | ORAL | Status: AC
  Administered 2016-02-11: 30 mL via ORAL
  Filled 2016-02-11: qty 30

## 2016-02-11 MED ORDER — SODIUM CHLORIDE 0.9 % IV BOLUS (SEPSIS)
1000.0000 mL | Freq: Once | INTRAVENOUS | Status: AC
  Administered 2016-02-11: 1000 mL via INTRAVENOUS

## 2016-02-11 MED ORDER — IOPAMIDOL (ISOVUE-370) INJECTION 76%
INTRAVENOUS | Status: AC
  Administered 2016-02-11: 80 mL via INTRAVENOUS
  Filled 2016-02-11: qty 100

## 2016-02-11 NOTE — ED Provider Notes (Signed)
CSN: [id number]     Arrival date & time 02/11/16  1548 History   First MD Initiated Contact with Patient 02/11/16 1604     Chief Complaint  Patient presents with  . Pain   (Consider location/radiation/quality/duration/timing/severity/associated sxs/prior Treatment) Patient is a 50 y.o. female presenting with abdominal pain. The history is provided by the patient.  Abdominal Pain Pain location:  LUQ Pain quality: burning and sharp   Pain radiates to:  Epigastric region Pain severity:  Moderate Onset quality:  Sudden Progression:  Partially resolved Chronicity:  New Context comment:  Mult assoc sx of sob and weakness with doe and near syncope sx. Relieved by:  None tried Worsened by:  Nothing tried Ineffective treatments:  None tried Associated symptoms: shortness of breath   Associated symptoms: no fever, no nausea and no vomiting     Past Medical History  Diagnosis Date  . Hypertension   . GERD (gastroesophageal reflux disease)    Past Surgical History  Procedure Laterality Date  . No past surgeries    . Robotic assisted total hysterectomy N/A 01/01/2014    Procedure: ROBOTIC ASSISTED TOTAL HYSTERECTOMY;  Surgeon: Princess Bruins, MD;  Location: [facility];  Service: Gynecology;  Laterality: N/A;  . Bilateral salpingectomy Bilateral 01/01/2014    Procedure: BILATERAL SALPINGECTOMY;  Surgeon: Princess Bruins, MD;  Location: [facility];  Service: Gynecology;  Laterality: Bilateral;   History reviewed. No pertinent family history. Social History  Substance Use Topics  . Smoking status: Never Smoker   . Smokeless tobacco: None  . Alcohol Use: No   OB History    No data available     Review of Systems  Constitutional: Positive for appetite change. Negative for fever and activity change.  Respiratory: Positive for shortness of breath.   Gastrointestinal: Positive for abdominal pain. Negative for nausea and vomiting.  All other systems reviewed and are negative.   Allergies    Review of patient's allergies indicates no known allergies.  Home Medications   Prior to Admission medications   Medication Sig Start Date End Date Taking? Authorizing Provider  losartan (COZAAR) 25 MG tablet Take 25 mg by mouth daily.   Yes Historical Provider, MD  acetaminophen (TYLENOL) 500 MG tablet Take 1,000 mg by mouth every 6 (six) hours as needed for mild pain.    Historical Provider, MD  aspirin EC 81 MG tablet Take 81 mg by mouth daily.    Historical Provider, MD  ferrous sulfate 325 (65 FE) MG tablet Take 325 mg by mouth 2 (two) times daily with a meal.    Historical Provider, MD  hydrochlorothiazide (HYDRODIURIL) 50 MG tablet Take 50 mg by mouth daily.    Historical Provider, MD  hydrocodone-ibuprofen (VICOPROFEN) 5-200 MG per tablet Take 1 tablet by mouth every 8 (eight) hours as needed for pain. 01/02/14   Princess Bruins, MD  metoprolol succinate (TOPROL-XL) 100 MG 24 hr tablet Take by mouth daily. Take with or immediately following a meal.    Historical Provider, MD  omeprazole (PRILOSEC) 20 MG capsule Take 20 mg by mouth daily as needed. Reflux    Historical Provider, MD   Meds Ordered and Administered this Visit  Medications - No data to display  BP 207/119 mmHg  Pulse 74  Temp(Src) 98.6 F (37 C) (Oral)  Resp 18  SpO2 100%  LMP 12/19/2013 No data found.   Physical Exam  Constitutional: She is oriented to person, place, and time. She appears well-developed and well-nourished.  Cardiovascular: Normal  rate, regular rhythm, normal heart sounds and intact distal pulses.   Pulmonary/Chest: Effort normal and breath sounds normal. She exhibits tenderness.  Abdominal: Soft. Bowel sounds are normal. There is no hepatosplenomegaly. There is tenderness in the left upper quadrant. There is no CVA tenderness.    Neurological: She is alert and oriented to person, place, and time.  Skin: Skin is warm and dry.  Nursing note and vitals reviewed.   ED Course  Procedures  (including critical care time)  Labs Review Labs Reviewed - No data to display  Imaging Review No results found.   Visual Acuity Review  Right Eye Distance:   Left Eye Distance:   Bilateral Distance:    Right Eye Near:   Left Eye Near:    Bilateral Near:         MDM   1. LUQ abdominal pain    Sent for eval of luq pain and fatigue and sob and weakness. Hypertensive.    Billy Fischer, MD 02/11/16 [phone]

## 2016-02-11 NOTE — ED Notes (Signed)
Pt c/o 1 week history of LUQ pain. She reports the pain increases with inspiration. The pain has decreased since onset. She is alert and breathing easily. She is hypertensive, states she didn't take her BP medication this am because she didn't feel well enough

## 2016-02-11 NOTE — ED Notes (Signed)
Pt cannot void at this time.  Stated that they voided in waiting room and was not made aware of the need for a sample

## 2016-02-11 NOTE — ED Notes (Signed)
Per Pt, Pt complains of three days of left upper quandrant pain. Denies any vomiting or diarrhea, but feel likes "I am unable to eat because I have a sour taste in my mouth." Pt reports feeling weak. Transferred from UC with HTN.

## 2016-02-11 NOTE — ED Provider Notes (Signed)
CSN: [id number]     Arrival date & time 02/11/16  1736 History   First MD Initiated Contact with Patient 02/11/16 2023     Chief Complaint  Patient presents with  . Abdominal Pain     (Consider location/radiation/quality/duration/timing/severity/associated sxs/prior Treatment) HPI Comments: Patient is a 50 year old female with a history of hypertension and esophageal reflux. She presents to the emergency department for evaluation of 3 days of left-sided pleuritic chest pain. Symptoms have been constant and worsening. She describes the pain as sharp and reports that it is nonradiating. Pain worsens with deep inspiration. She has also had symptoms of shortness of breath and dyspnea on exertion. She reports some lightheadedness and generalized weakness. Patient denies syncope, fever, chills, leg swelling, recent surgeries, recent hospitalizations, hormone therapy, or a personal or family history of DVT/PE. She states that her lisinopril dose was increased from 20 mg to 40 mg. The patient reports that she did not tolerate this well, at which time she was changed to losartan. This change occurred 9 days ago. She did not take her losartan today because she attributes her pain to this medication change. She has continued to take her metoprolol as prescribed.  The patient also complains of a secondary complaint of anorexia. She states, "I am unable to eat because I have a sour taste in my mouth". She complains of food and water tasting differently. She has had some mild nausea. No vomiting or diarrhea. No melena, hematochezia, dysuria, or hematuria. Abdominal surgical hx significant for TAH/BSO.  Patient is a 50 y.o. female presenting with abdominal pain. The history is provided by the patient. No language interpreter was used.  Abdominal Pain Associated symptoms: nausea and shortness of breath   Associated symptoms: no chills, no diarrhea, no dysuria, no fever and no vomiting     Past Medical History    Diagnosis Date  . Hypertension   . GERD (gastroesophageal reflux disease)    Past Surgical History  Procedure Laterality Date  . No past surgeries    . Robotic assisted total hysterectomy N/A 01/01/2014    Procedure: ROBOTIC ASSISTED TOTAL HYSTERECTOMY;  Surgeon: Princess Bruins, MD;  Location: [facility];  Service: Gynecology;  Laterality: N/A;  . Bilateral salpingectomy Bilateral 01/01/2014    Procedure: BILATERAL SALPINGECTOMY;  Surgeon: Princess Bruins, MD;  Location: [facility];  Service: Gynecology;  Laterality: Bilateral;   No family history on file. Social History  Substance Use Topics  . Smoking status: Never Smoker   . Smokeless tobacco: None  . Alcohol Use: No   OB History    No data available      Review of Systems  Constitutional: Negative for fever and chills.  Respiratory: Positive for shortness of breath.   Cardiovascular: Negative for leg swelling.  Gastrointestinal: Positive for nausea and abdominal pain. Negative for vomiting and diarrhea.  Genitourinary: Negative for dysuria.  Neurological: Positive for light-headedness. Negative for syncope.  Ten systems reviewed and are negative for acute change, except as noted in the HPI.    Allergies  Review of patient's allergies indicates no known allergies.  Home Medications   Prior to Admission medications   Medication Sig Start Date End Date Taking? Authorizing Provider  aspirin EC 81 MG tablet Take 81 mg by mouth daily.   Yes Historical Provider, MD  losartan (COZAAR) 25 MG tablet Take 25 mg by mouth daily.   Yes Historical Provider, MD  metoprolol succinate (TOPROL-XL) 100 MG 24 hr tablet Take by mouth daily. Take  with or immediately following a meal.   Yes Historical Provider, MD  Propylene Glycol (SYSTANE BALANCE OP) Place 1 drop into both eyes daily as needed. For dry eyes   Yes Historical Provider, MD  hydrocodone-ibuprofen (VICOPROFEN) 5-200 MG per tablet Take 1 tablet by mouth every 8 (eight) hours as  needed for pain. Patient not taking: Reported on 02/11/2016 01/02/14   Princess Bruins, MD   BP 162/106 mmHg  Pulse 82  Temp(Src) 98 F (36.7 C) (Oral)  Resp 16  Ht 5\' 6"  (1.676 m)  Wt 71.033 kg  BMI 25.29 kg/m2  SpO2 100%  LMP 12/19/2013   Physical Exam  Constitutional: She is oriented to person, place, and time. She appears well-developed and well-nourished. No distress.  Nontoxic appearing, in no distress.  HENT:  Head: Normocephalic and atraumatic.  Eyes: Conjunctivae and EOM are normal. No scleral icterus.  Neck: Normal range of motion.  Cardiovascular: Normal rate, regular rhythm and intact distal pulses.   Pulmonary/Chest: Effort normal and breath sounds normal. No respiratory distress. She has no wheezes. She has no rales.    Chest expansion symmetric. No tachypnea or dyspnea. Clear breath sounds in all lung fields. Mildly reproducible pain with palpation to the L lower lateral chest wall. No crepitus.  Musculoskeletal: Normal range of motion.  No peripheral edema noted.  Neurological: She is alert and oriented to person, place, and time. She exhibits normal muscle tone. Coordination normal.  GCS 15. Patient moving all extremities.  Skin: Skin is warm and dry. No rash noted. She is not diaphoretic. No erythema. No pallor.  Psychiatric: She has a normal mood and affect. Her behavior is normal.  Nursing note and vitals reviewed.   ED Course  Procedures (including critical care time) Labs Review Labs Reviewed  COMPREHENSIVE METABOLIC PANEL - Abnormal; Notable for the following:    Glucose, Bld 102 (*)    Creatinine, Ser 1.69 (*)    Albumin 3.3 (*)    ALT 13 (*)    Total Bilirubin 0.2 (*)    GFR calc non Af Amer 34 (*)    GFR calc Af Amer 40 (*)    All other components within normal limits  CBC - Abnormal; Notable for the following:    Hemoglobin 11.7 (*)    MCV 75.8 (*)    MCH 24.6 (*)    All other components within normal limits  D-DIMER, QUANTITATIVE (NOT AT  [facility]) - Abnormal; Notable for the following:    D-Dimer, Quant 2.46 (*)    All other components within normal limits  LIPASE, BLOOD  URINALYSIS, ROUTINE W REFLEX MICROSCOPIC (NOT AT [facility])  TROPONIN I    Imaging Review Dg Chest 2 View  02/11/2016  CLINICAL DATA:  Patient with 3 days of left upper quadrant pain. EXAM: CHEST  2 VIEW COMPARISON:  None. FINDINGS: The heart size and mediastinal contours are within normal limits. Both lungs are clear. The visualized skeletal structures are unremarkable. IMPRESSION: No active cardiopulmonary disease. Electronically Signed   By: Lovey Newcomer M.D.   On: 02/11/2016 21:08   Ct Angio Chest Pe W/cm &/or Wo Cm  02/11/2016  CLINICAL DATA:  One week history of left upper quadrant pain. Pain increases with inspiration. EXAM: CT ANGIOGRAPHY CHEST WITH CONTRAST TECHNIQUE: Multidetector CT imaging of the chest was performed using the standard protocol during bolus administration of intravenous contrast. Multiplanar CT image reconstructions and MIPs were obtained to evaluate the vascular anatomy. CONTRAST:  80 mL Isovue 370  COMPARISON:  None. FINDINGS: Technically adequate study with good opacification of the central and segmental pulmonary arteries. No focal filling defects demonstrated. No evidence of significant pulmonary embolus. Normal heart size. Normal caliber thoracic aorta. Esophagus is decompressed. No significant lymphadenopathy in the chest. Evaluation of lungs is limited due to respiratory motion artifact. There is atelectasis in the posterior lungs, likely dependent. No focal consolidation. No pleural effusions. No pneumothorax. Airways are patent. Included portions of the upper abdominal organs are grossly unremarkable. No destructive bone lesions. Review of the MIP images confirms the above findings. IMPRESSION: No evidence of significant pulmonary embolus. Dependent atelectasis in the lungs. Electronically Signed   By: Lucienne Capers M.D.   On: 02/11/2016  23:28     I have personally reviewed and evaluated these images and lab results as part of my medical decision-making.   EKG Interpretation   Date/Time:  Saturday February 12 2016 00:25:55 EDT Ventricular Rate:  73 PR Interval:  158 QRS Duration: 79 QT Interval:  365 QTC Calculation: 402 R Axis:   43 Text Interpretation:  Sinus rhythm since last tracing no significant  change Confirmed by Eulis Foster  MD, ELLIOTT [id number]) on 02/12/2016 12:31:17 AM       10:28 PM Patient with elevated D dimer. Will order CTA; believe this is most appropriate with lower dose of contrast despite creatinine. Patient made aware of next steps. She feels the GI cocktail helped her "stomach pain" a bit. No other complaints at present.  MDM   Final diagnoses:  Essential hypertension  Elevated serum creatinine  Costochondral chest pain  Gastroesophageal reflux disease, esophagitis presence not specified    Patient presenting for c/o L sided pleuritic chest pain with mild DOE. Symptoms have been constant x 3 days. She also reports anorexia and a sour taste in her mouth, stating that sometimes her food or drinks have tasted different to her. She does have a hx of reflux and is not on medication for this.   Patient reports some improvement in her stomach symptoms after a GI cocktail. She was noted to be hypertensive on arrival, prompting her transfer from urgent care; however this is likely due to medication noncompliance as patient has not taker her Losartan today. She was recently changed to this medication from Lisinopril. There was improvement in her BP from 207/119 to 185/101 after being given her prescribed medicine.  Troponin is negative today, making emergent cardiology less likely. Given chronicity of symptoms, I also have a lower suspicion for this. Heart score is 2-3 (based on suspicion, age, and risk factors) c/w low risk of ACS. A D dimer was performed given nature of the patient's chest pain. PE was ruled  out on CT angiogram following an abnormal dimer result.  Suspect pleuritic chest pain to be costochondral in nature. Patient is afebrile today and without leukocytosis making infection less likely. Liver function preserved. Kidney function is mildly elevated. Question dehydration; will have patient have this rechecked by her PCP. Doubt emergent abdominal etiology. Plan to d/c with instruction for outpatient f/u. Rx given for Protonix and Carafate. Return precautions discussed and provided. Patient discharged in satisfactory condition with no unaddressed concerns.    Antonietta Breach, PA-C 02/12/16 0032  Antonietta Breach, PA-C 02/12/16 [id number]  Daleen Bo, MD 02/12/16 0100

## 2016-02-11 NOTE — Discharge Instructions (Signed)
Costochondritis Costochondritis, sometimes called Tietze syndrome, is a swelling and irritation (inflammation) of the tissue (cartilage) that connects your ribs with your breastbone (sternum). It causes pain in the chest and rib area. Costochondritis usually goes away on its own over time. It can take up to 6 weeks or longer to get better, especially if you are unable to limit your activities. CAUSES  Some cases of costochondritis have no known cause. Possible causes include:  Injury (trauma).  Exercise or activity such as lifting.  Severe coughing. SIGNS AND SYMPTOMS  Pain and tenderness in the chest and rib area.  Pain that gets worse when coughing or taking deep breaths.  Pain that gets worse with specific movements. DIAGNOSIS  Your health care provider will do a physical exam and ask about your symptoms. Chest X-rays or other tests may be done to rule out other problems. TREATMENT  Costochondritis usually goes away on its own over time. Your health care provider may prescribe medicine to help relieve pain. HOME CARE INSTRUCTIONS   Avoid exhausting physical activity. Try not to strain your ribs during normal activity. This would include any activities using chest, abdominal, and side muscles, especially if heavy weights are used.  Apply ice to the affected area for the first 2 days after the pain begins.  Put ice in a plastic bag.  Place a towel between your skin and the bag.  Leave the ice on for 20 minutes, 2-3 times a day.  Only take over-the-counter or prescription medicines as directed by your health care provider. SEEK MEDICAL CARE IF:  You have redness or swelling at the rib joints. These are signs of infection.  Your pain does not go away despite rest or medicine. SEEK IMMEDIATE MEDICAL CARE IF:   Your pain increases or you are very uncomfortable.  You have shortness of breath or difficulty breathing.  You cough up blood.  You have worse chest pains,  sweating, or vomiting.  You have a fever or persistent symptoms for more than 2-3 days.  You have a fever and your symptoms suddenly get worse. MAKE SURE YOU:   Understand these instructions.  Will watch your condition.  Will get help right away if you are not doing well or get worse.   This information is not intended to replace advice given to you by your health care provider. Make sure you discuss any questions you have with your health care provider.   Document Released: 05/24/2005 Document Revised: 06/04/2013 Document Reviewed: 03/18/2013 Elsevier Interactive Patient Education 2016 [employer].  Gastroesophageal Reflux Disease, Adult Normally, food travels down the esophagus and stays in the stomach to be digested. However, when a person has gastroesophageal reflux disease (GERD), food and stomach acid move back up into the esophagus. When this happens, the esophagus becomes sore and inflamed. Over time, GERD can create small holes (ulcers) in the lining of the esophagus.  CAUSES This condition is caused by a problem with the muscle between the esophagus and the stomach (lower esophageal sphincter, or LES). Normally, the LES muscle closes after food passes through the esophagus to the stomach. When the LES is weakened or abnormal, it does not close properly, and that allows food and stomach acid to go back up into the esophagus. The LES can be weakened by certain dietary substances, medicines, and medical conditions, including:  Tobacco use.  Pregnancy.  Having a hiatal hernia.  Heavy alcohol use.  Certain foods and beverages, such as coffee, chocolate, onions, and peppermint.  RISK FACTORS This condition is more likely to develop in:  People who have an increased body weight.  People who have connective tissue disorders.  People who use NSAID medicines. SYMPTOMS Symptoms of this condition include:  Heartburn.  Difficult or painful swallowing.  The feeling of  having a lump in the throat.  Abitter taste in the mouth.  Bad breath.  Having a large amount of saliva.  Having an upset or bloated stomach.  Belching.  Chest pain.  Shortness of breath or wheezing.  Ongoing (chronic) cough or a night-time cough.  Wearing away of tooth enamel.  Weight loss. Different conditions can cause chest pain. Make sure to see your health care provider if you experience chest pain. DIAGNOSIS Your health care provider will take a medical history and perform a physical exam. To determine if you have mild or severe GERD, your health care provider may also monitor how you respond to treatment. You may also have other tests, including:  An endoscopy toexamine your stomach and esophagus with a small camera.  A test thatmeasures the acidity level in your esophagus.  A test thatmeasures how much pressure is on your esophagus.  A barium swallow or modified barium swallow to show the shape, size, and functioning of your esophagus. TREATMENT The goal of treatment is to help relieve your symptoms and to prevent complications. Treatment for this condition may vary depending on how severe your symptoms are. Your health care provider may recommend:  Changes to your diet.  Medicine.  Surgery. HOME CARE INSTRUCTIONS Diet  Follow a diet as recommended by your health care provider. This may involve avoiding foods and drinks such as:  Coffee and tea (with or without caffeine).  Drinks that containalcohol.  Energy drinks and sports drinks.  Carbonated drinks or sodas.  Chocolate and cocoa.  Peppermint and mint flavorings.  Garlic and onions.  Horseradish.  Spicy and acidic foods, including peppers, chili powder, curry powder, vinegar, hot sauces, and barbecue sauce.  Citrus fruit juices and citrus fruits, such as oranges, lemons, and limes.  Tomato-based foods, such as red sauce, chili, salsa, and pizza with red sauce.  Fried and fatty foods,  such as donuts, french fries, potato chips, and high-fat dressings.  High-fat meats, such as hot dogs and fatty cuts of red and white meats, such as rib eye steak, sausage, ham, and bacon.  High-fat dairy items, such as whole milk, butter, and cream cheese.  Eat small, frequent meals instead of large meals.  Avoid drinking large amounts of liquid with your meals.  Avoid eating meals during the 2-3 hours before bedtime.  Avoid lying down right after you eat.  Do not exercise right after you eat. General Instructions  Pay attention to any changes in your symptoms.  Take over-the-counter and prescription medicines only as told by your health care provider. Do not take aspirin, ibuprofen, or other NSAIDs unless your health care provider told you to do so.  Do not use any tobacco products, including cigarettes, chewing tobacco, and e-cigarettes. If you need help quitting, ask your health care provider.  Wear loose-fitting clothing. Do not wear anything tight around your waist that causes pressure on your abdomen.  Raise (elevate) the head of your bed 6 inches (15cm).  Try to reduce your stress, such as with yoga or meditation. If you need help reducing stress, ask your health care provider.  If you are overweight, reduce your weight to an amount that is healthy for you. Ask  your health care provider for guidance about a safe weight loss goal.  Keep all follow-up visits as told by your health care provider. This is important. SEEK MEDICAL CARE IF:  You have new symptoms.  You have unexplained weight loss.  You have difficulty swallowing, or it hurts to swallow.  You have wheezing or a persistent cough.  Your symptoms do not improve with treatment.  You have a hoarse voice. SEEK IMMEDIATE MEDICAL CARE IF:  You have pain in your arms, neck, jaw, teeth, or back.  You feel sweaty, dizzy, or light-headed.  You have chest pain or shortness of breath.  You vomit and your  vomit looks like blood or coffee grounds.  You faint.  Your stool is bloody or black.  You cannot swallow, drink, or eat.   This information is not intended to replace advice given to you by your health care provider. Make sure you discuss any questions you have with your health care provider.   Document Released: 05/24/2005 Document Revised: 05/05/2015 Document Reviewed: 12/09/2014 Elsevier Interactive Patient Education 2016 [employer].  Hypertension Hypertension, commonly called high blood pressure, is when the force of blood pumping through your arteries is too strong. Your arteries are the blood vessels that carry blood from your heart throughout your body. A blood pressure reading consists of a higher number over a lower number, such as 110/72. The higher number (systolic) is the pressure inside your arteries when your heart pumps. The lower number (diastolic) is the pressure inside your arteries when your heart relaxes. Ideally you want your blood pressure below 120/80. Hypertension forces your heart to work harder to pump blood. Your arteries may become narrow or stiff. Having untreated or uncontrolled hypertension can cause heart attack, stroke, kidney disease, and other problems. RISK FACTORS Some risk factors for high blood pressure are controllable. Others are not.  Risk factors you cannot control include:   Race. You may be at higher risk if you are African American.  Age. Risk increases with age.  Gender. Men are at higher risk than women before age 58 years. After age 29, women are at higher risk than men. Risk factors you can control include:  Not getting enough exercise or physical activity.  Being overweight.  Getting too much fat, sugar, calories, or salt in your diet.  Drinking too much alcohol. SIGNS AND SYMPTOMS Hypertension does not usually cause signs or symptoms. Extremely high blood pressure (hypertensive crisis) may cause headache, anxiety, shortness of  breath, and nosebleed. DIAGNOSIS To check if you have hypertension, your health care provider will measure your blood pressure while you are seated, with your arm held at the level of your heart. It should be measured at least twice using the same arm. Certain conditions can cause a difference in blood pressure between your right and left arms. A blood pressure reading that is higher than normal on one occasion does not mean that you need treatment. If it is not clear whether you have high blood pressure, you may be asked to return on a different day to have your blood pressure checked again. Or, you may be asked to monitor your blood pressure at home for 1 or more weeks. TREATMENT Treating high blood pressure includes making lifestyle changes and possibly taking medicine. Living a healthy lifestyle can help lower high blood pressure. You may need to change some of your habits. Lifestyle changes may include:  Following the DASH diet. This diet is high in fruits, vegetables, and  whole grains. It is low in salt, red meat, and added sugars.  Keep your sodium intake below 2,300 mg per day.  Getting at least 30-45 minutes of aerobic exercise at least 4 times per week.  Losing weight if necessary.  Not smoking.  Limiting alcoholic beverages.  Learning ways to reduce stress. Your health care provider may prescribe medicine if lifestyle changes are not enough to get your blood pressure under control, and if one of the following is true:  You are 25-37 years of age and your systolic blood pressure is above 140.  You are 68 years of age or older, and your systolic blood pressure is above 150.  Your diastolic blood pressure is above 90.  You have diabetes, and your systolic blood pressure is over [id number] or your diastolic blood pressure is over 90.  You have kidney disease and your blood pressure is above 140/90.  You have heart disease and your blood pressure is above 140/90. Your personal target  blood pressure may vary depending on your medical conditions, your age, and other factors. HOME CARE INSTRUCTIONS  Have your blood pressure rechecked as directed by your health care provider.   Take medicines only as directed by your health care provider. Follow the directions carefully. Blood pressure medicines must be taken as prescribed. The medicine does not work as well when you skip doses. Skipping doses also puts you at risk for problems.  Do not smoke.   Monitor your blood pressure at home as directed by your health care provider. SEEK MEDICAL CARE IF:   You think you are having a reaction to medicines taken.  You have recurrent headaches or feel dizzy.  You have swelling in your ankles.  You have trouble with your vision. SEEK IMMEDIATE MEDICAL CARE IF:  You develop a severe headache or confusion.  You have unusual weakness, numbness, or feel faint.  You have severe chest or abdominal pain.  You vomit repeatedly.  You have trouble breathing. MAKE SURE YOU:   Understand these instructions.  Will watch your condition.  Will get help right away if you are not doing well or get worse.   This information is not intended to replace advice given to you by your health care provider. Make sure you discuss any questions you have with your health care provider.   Document Released: 08/14/2005 Document Revised: 12/29/2014 Document Reviewed: 06/06/2013 Elsevier Interactive Patient Education [employer].

## 2016-02-12 DIAGNOSIS — K210 Gastro-esophageal reflux disease with esophagitis: Secondary | ICD-10-CM | POA: Diagnosis not present

## 2016-02-12 NOTE — ED Notes (Signed)
Patient able to ambulate independently  

## 2016-02-17 ENCOUNTER — Emergency Department (HOSPITAL_COMMUNITY)
Admission: EM | Admit: 2016-02-17 | Discharge: 2016-02-18 | Disposition: A | Discharge status: Other Acute Inpatient Hospital | Payer: BLUE CROSS/BLUE SHIELD | Attending: Emergency Medicine | Admitting: Emergency Medicine

## 2016-02-17 ENCOUNTER — Emergency Department (HOSPITAL_COMMUNITY): Payer: BLUE CROSS/BLUE SHIELD

## 2016-02-17 ENCOUNTER — Encounter (HOSPITAL_COMMUNITY): Payer: Self-pay | Admitting: Radiology

## 2016-02-17 ENCOUNTER — Other Ambulatory Visit: Payer: Self-pay

## 2016-02-17 DIAGNOSIS — R1013 Epigastric pain: Secondary | ICD-10-CM | POA: Diagnosis present

## 2016-02-17 DIAGNOSIS — Z79899 Other long term (current) drug therapy: Secondary | ICD-10-CM | POA: Insufficient documentation

## 2016-02-17 DIAGNOSIS — R109 Unspecified abdominal pain: Secondary | ICD-10-CM

## 2016-02-17 DIAGNOSIS — I714 Abdominal aortic aneurysm, without rupture, unspecified: Secondary | ICD-10-CM

## 2016-02-17 DIAGNOSIS — Z7982 Long term (current) use of aspirin: Secondary | ICD-10-CM | POA: Insufficient documentation

## 2016-02-17 DIAGNOSIS — I10 Essential (primary) hypertension: Secondary | ICD-10-CM | POA: Insufficient documentation

## 2016-02-17 LAB — PROTIME-INR
INR: 1.13 (ref 0.00–1.49)
PROTHROMBIN TIME: 14.7 s (ref 11.6–15.2)

## 2016-02-17 LAB — TYPE AND SCREEN
ABO/RH(D): A POS
ANTIBODY SCREEN: NEGATIVE

## 2016-02-17 LAB — COMPREHENSIVE METABOLIC PANEL
ALBUMIN: 3.3 g/dL — AB (ref 3.5–5.0)
ALT: 12 U/L — ABNORMAL LOW (ref 14–54)
ANION GAP: 8 (ref 5–15)
AST: 15 U/L (ref 15–41)
Alkaline Phosphatase: 69 U/L (ref 38–126)
BUN: 18 mg/dL (ref 6–20)
CHLORIDE: 104 mmol/L (ref 101–111)
CO2: 26 mmol/L (ref 22–32)
Calcium: 10.1 mg/dL (ref 8.9–10.3)
Creatinine, Ser: 2.02 mg/dL — ABNORMAL HIGH (ref 0.44–1.00)
GFR calc Af Amer: 32 mL/min — ABNORMAL LOW (ref >60)
GFR calc non Af Amer: 28 mL/min — ABNORMAL LOW (ref >60)
GLUCOSE: 94 mg/dL (ref 65–99)
POTASSIUM: 3.6 mmol/L (ref 3.5–5.1)
Sodium: 138 mmol/L (ref 135–145)
Total Bilirubin: 0.6 mg/dL (ref 0.3–1.2)
Total Protein: 8.1 g/dL (ref 6.5–8.1)

## 2016-02-17 LAB — CBC
HEMATOCRIT: 36.9 % (ref 36.0–46.0)
HEMOGLOBIN: 11.9 g/dL — AB (ref 12.0–15.0)
MCH: 24.1 pg — ABNORMAL LOW (ref 26.0–34.0)
MCHC: 32.2 g/dL (ref 30.0–36.0)
MCV: 74.8 fL — AB (ref 78.0–100.0)
Platelets: 194 10*3/uL (ref 150–400)
RBC: 4.93 MIL/uL (ref 3.87–5.11)
RDW: 14.6 % (ref 11.5–15.5)
WBC: 4.4 10*3/uL (ref 4.0–10.5)

## 2016-02-17 LAB — ABO/RH: ABO/RH(D): A POS

## 2016-02-17 LAB — LACTIC ACID, PLASMA: LACTIC ACID, VENOUS: 1.3 mmol/L (ref 0.5–2.0)

## 2016-02-17 LAB — LIPASE, BLOOD: LIPASE: 30 U/L (ref 11–51)

## 2016-02-17 MED ORDER — HYDROCODONE-ACETAMINOPHEN 5-325 MG PO TABS: 1.0000 | ORAL_TABLET | Freq: Four times a day (QID) | ORAL | Status: DC | PRN

## 2016-02-17 MED ORDER — ONDANSETRON 4 MG PO TBDP: ORAL_TABLET | ORAL | Status: DC

## 2016-02-17 MED ORDER — NICARDIPINE HCL IN NACL 20-0.86 MG/200ML-% IV SOLN
3.0000 mg/h | Freq: Once | INTRAVENOUS | Status: AC
  Administered 2016-02-17: 5 mg/h via INTRAVENOUS
  Filled 2016-02-17: qty 200

## 2016-02-17 MED ORDER — SODIUM CHLORIDE 0.9 % IV SOLN
1000.0000 mL | INTRAVENOUS | Status: DC
  Administered 2016-02-18: 1000 mL via INTRAVENOUS

## 2016-02-17 MED ORDER — IOPAMIDOL (ISOVUE-370) INJECTION 76%
INTRAVENOUS | Status: AC
  Administered 2016-02-17: 60 mL
  Filled 2016-02-17: qty 100

## 2016-02-17 MED ORDER — ONDANSETRON HCL 4 MG/2ML IJ SOLN
4.0000 mg | Freq: Once | INTRAMUSCULAR | Status: AC
  Administered 2016-02-17: 4 mg via INTRAVENOUS
  Filled 2016-02-17: qty 2

## 2016-02-17 MED ORDER — HYDROMORPHONE HCL 1 MG/ML IJ SOLN
1.0000 mg | Freq: Once | INTRAMUSCULAR | Status: AC
  Administered 2016-02-17: 1 mg via INTRAVENOUS
  Filled 2016-02-17: qty 1

## 2016-02-17 MED ORDER — LABETALOL HCL 5 MG/ML IV SOLN
20.0000 mg | Freq: Once | INTRAVENOUS | Status: AC
  Administered 2016-02-17: 20 mg via INTRAVENOUS
  Filled 2016-02-17: qty 4

## 2016-02-17 MED ORDER — DIATRIZOATE MEGLUMINE & SODIUM 66-10 % PO SOLN
ORAL | Status: AC
  Filled 2016-02-17: qty 30

## 2016-02-17 MED ORDER — SODIUM CHLORIDE 0.9 % IV SOLN
1000.0000 mL | Freq: Once | INTRAVENOUS | Status: AC
  Administered 2016-02-17: 1000 mL via INTRAVENOUS

## 2016-02-17 NOTE — ED Notes (Signed)
Patient transported to CT 

## 2016-02-17 NOTE — ED Provider Notes (Signed)
CSN: [id number]     Arrival date & time 02/17/16  1016 History  By signing my name below, I, Connie Davis, attest that this documentation has been prepared under the direction and in the presence of Connie Ferguson, MD. Electronically Signed: Eustaquio Davis, ED Scribe. 02/17/2016. 2:15 PM.   Chief Complaint  Patient presents with  . Abdominal Pain   Patient is a 50 y.o. female presenting with abdominal pain. The history is provided by the patient (Patient complains of epigastric abdominal pain she's been on protonic and carefully without help. She saw her family doctor yesterday who referred her to GI). No language interpreter was used.  Abdominal Pain Pain location:  Epigastric Pain quality: aching   Pain radiates to:  Does not radiate Pain severity:  Moderate Onset quality:  Gradual Timing:  Constant Progression:  Waxing and waning Chronicity:  New Context: not alcohol use   Associated symptoms: no chest pain, no cough, no diarrhea, no fatigue and no hematuria     HPI Comments: Connie Davis is a 50 y.o. female who presents to the Emergency Department complaining of gradual onset, constant, epigastric abdominal pain x 1.5 weeks. Pt was seen in the ED on 02/11/2016 for left sided pleuritic chest pain with anorexia and a sour taste in her mouth. Pt received a GI cocktail with improvement. She was given a prescription for Protonix and Carafate which she states has not relieved her abdominal pain, prompting her to return today. Denies any other associated symptoms.   Past Medical History  Diagnosis Date  . Hypertension   . GERD (gastroesophageal reflux disease)    Past Surgical History  Procedure Laterality Date  . No past surgeries    . Robotic assisted total hysterectomy N/A 01/01/2014    Procedure: ROBOTIC ASSISTED TOTAL HYSTERECTOMY;  Surgeon: Princess Bruins, MD;  Location: [facility];  Service: Gynecology;  Laterality: N/A;  . Bilateral salpingectomy Bilateral 01/01/2014    Procedure:  BILATERAL SALPINGECTOMY;  Surgeon: Princess Bruins, MD;  Location: [facility];  Service: Gynecology;  Laterality: Bilateral;   No family history on file. Social History  Substance Use Topics  . Smoking status: Never Smoker   . Smokeless tobacco: Not on file  . Alcohol Use: No   OB History    No data available     Review of Systems  Constitutional: Positive for appetite change. Negative for fatigue.  HENT: Negative for congestion, ear discharge and sinus pressure.   Eyes: Negative for discharge.  Respiratory: Negative for cough.   Cardiovascular: Negative for chest pain.  Gastrointestinal: Positive for abdominal pain. Negative for diarrhea.  Genitourinary: Negative for frequency and hematuria.  Musculoskeletal: Negative for back pain.  Skin: Negative for rash.  Neurological: Negative for seizures and headaches.  Psychiatric/Behavioral: Negative for hallucinations.   Allergies  Review of patient's allergies indicates no known allergies.  Home Medications   Prior to Admission medications   Medication Sig Start Date End Date Taking? Authorizing Provider  acetaminophen (TYLENOL) 500 MG tablet Take 1 tablet (500 mg total) by mouth every 6 (six) hours as needed for mild pain or moderate pain. 02/11/16   Antonietta Breach, PA-C  aspirin EC 81 MG tablet Take 81 mg by mouth daily.    Historical Provider, MD  hydrocodone-ibuprofen (VICOPROFEN) 5-200 MG per tablet Take 1 tablet by mouth every 8 (eight) hours as needed for pain. Patient not taking: Reported on 02/11/2016 01/02/14   Princess Bruins, MD  losartan (COZAAR) 25 MG tablet Take 25 mg by  mouth daily.    Historical Provider, MD  metoprolol succinate (TOPROL-XL) 100 MG 24 hr tablet Take by mouth daily. Take with or immediately following a meal.    Historical Provider, MD  pantoprazole (PROTONIX) 20 MG tablet Take 1 tablet (20 mg total) by mouth daily. 02/11/16   Antonietta Breach, PA-C  Propylene Glycol (SYSTANE BALANCE OP) Place 1 drop into both  eyes daily as needed. For dry eyes    Historical Provider, MD  sucralfate (CARAFATE) 1 GM/10ML suspension Take 10 mLs (1 g total) by mouth 4 (four) times daily -  with meals and at bedtime. 02/11/16   Antonietta Breach, PA-C   BP 213/109 mmHg  Pulse 57  Temp(Src) 98 F (36.7 C) (Oral)  Resp 13  SpO2 99%  LMP 12/19/2013 Physical Exam  Constitutional: She is oriented to person, place, and time. She appears well-developed.  HENT:  Head: Normocephalic.  Eyes: Conjunctivae and EOM are normal. No scleral icterus.  Neck: Neck supple. No thyromegaly present.  Cardiovascular: Normal rate and regular rhythm.  Exam reveals no gallop and no friction rub.   No murmur heard. Pulmonary/Chest: No stridor. She has no wheezes. She has no rales. She exhibits no tenderness.  Abdominal: She exhibits no distension. There is tenderness. There is no rebound.  Moderate epigastric imaging  Musculoskeletal: Normal range of motion. She exhibits no edema.  Lymphadenopathy:    She has no cervical adenopathy.  Neurological: She is oriented to person, place, and time. She exhibits normal muscle tone. Coordination normal.  Skin: No rash noted. No erythema.  Psychiatric: She has a normal mood and affect. Her behavior is normal.    ED Course  Procedures (including critical care time)  DIAGNOSTIC STUDIES: Oxygen Saturation is 99% on RA, normal by my interpretation.    COORDINATION OF CARE: 2:14 PM-Discussed treatment plan which includes CT A/P with pt at bedside and pt agreed to plan.   Labs Review Labs Reviewed  COMPREHENSIVE METABOLIC PANEL - Abnormal; Notable for the following:    Creatinine, Ser 2.02 (*)    Albumin 3.3 (*)    ALT 12 (*)    GFR calc non Af Amer 28 (*)    GFR calc Af Amer 32 (*)    All other components within normal limits  CBC - Abnormal; Notable for the following:    Hemoglobin 11.9 (*)    MCV 74.8 (*)    MCH 24.1 (*)    All other components within normal limits  LIPASE, BLOOD   URINALYSIS, ROUTINE W REFLEX MICROSCOPIC (NOT AT [facility])    Imaging Review No results found. I have personally reviewed and evaluated these images and lab results as part of my medical decision-making.   EKG Interpretation None      MDM   Final diagnoses:  None    Patient with continued abdominal pain. Patient will get a CT of her abdomen.     Connie Ferguson, MD 02/19/16 1259

## 2016-02-17 NOTE — Discharge Instructions (Signed)
Follow up with your md next week and follow up with the gi md he is referring you to

## 2016-02-17 NOTE — ED Provider Notes (Signed)
Given the complex nature of her aneurysm this will need to be repaired at [facility].  Dr. Trula Slade has spoken with Dr. Sammuel Hines and Dr. Sammuel Hines excepted the patient in transfer to [facility].  We are arranging transportation at this time.    Jola Schmidt, MD 02/17/16 2216

## 2016-02-17 NOTE — ED Provider Notes (Signed)
CRITICAL CARE Performed by: Hoy Morn Total critical care time: 45 minutes Critical care time was exclusive of separately billable procedures and treating other patients. Critical care was necessary to treat or prevent imminent or life-threatening deterioration. Critical care was time spent personally by me on the following activities: development of treatment plan with patient and/or surrogate as well as nursing, discussions with consultants, evaluation of patient's response to treatment, examination of patient, obtaining history from patient or surrogate, ordering and performing treatments and interventions, ordering and review of laboratory studies, ordering and review of radiographic studies, pulse oximetry and re-evaluation of patient's condition.   Angiocath insertion Performed by: Hoy Morn Consent: Verbal consent obtained. Risks and benefits: risks, benefits and alternatives were discussed Time out: Immediately prior to procedure a "time out" was called to verify the correct patient, procedure, equipment, support staff and site/side marked as required. Preparation: Patient was prepped and draped in the usual sterile fashion. Vein Location: right AC Ultrasound Guided Gauge: 18 Normal blood return and flush without difficulty Patient tolerance: Patient tolerated the procedure well with no immediate complications.    Patient with large abdominal symptomatic aneurysm.  This will need intervention tonight.  I spoke with Dr. Trula Slade of vascular surgery who will plan to see the patient emergency department.  He is requesting IV contrasted CTA abdomen and pelvis to properly evaluate the aorta prior to surgical repair. .  She does have a mild elevation in her creatinine today up to 2.02.  It was 1.695 days ago.  Long discussion with the patient and she understands the risks and benefits of the IV contrasted study.  We discussed the case with radiology as well and we will do a reduced  dose contrasted study to evaluate the aorta further for appropriate preoperative planning.   In regards to her elevated hypertension despite IV labetalol this continues to be elevated.  She will be started on a Cardene drip.  2 IVs in place.  Type and screen added.  Preoperative EKG.  Patient understands the severity of her illness.  Patient and spouse in agreement with moving forward with the plan   I personally reviewed the imaging tests through PACS system I reviewed available ER/hospitalization records through the [place], MD 02/17/16 2008

## 2016-02-17 NOTE — ED Notes (Signed)
Nurse is awear of pt.bloodpressure213/109

## 2016-02-17 NOTE — ED Notes (Signed)
Still having abd pain left side since she was seen here a week ago  and she states  She is not eating and drinking as well

## 2016-02-18 DIAGNOSIS — I714 Abdominal aortic aneurysm, without rupture, unspecified: Secondary | ICD-10-CM | POA: Insufficient documentation

## 2016-02-18 DIAGNOSIS — Z79899 Other long term (current) drug therapy: Secondary | ICD-10-CM | POA: Diagnosis not present

## 2016-02-18 DIAGNOSIS — R1013 Epigastric pain: Secondary | ICD-10-CM | POA: Diagnosis present

## 2016-02-18 DIAGNOSIS — Z7982 Long term (current) use of aspirin: Secondary | ICD-10-CM | POA: Diagnosis not present

## 2016-02-18 DIAGNOSIS — I10 Essential (primary) hypertension: Secondary | ICD-10-CM | POA: Diagnosis not present

## 2016-02-18 MED ORDER — MORPHINE SULFATE (PF) 4 MG/ML IV SOLN
4.0000 mg | Freq: Once | INTRAVENOUS | Status: AC
  Administered 2016-02-18: 4 mg via INTRAVENOUS
  Filled 2016-02-18: qty 1

## 2016-02-18 MED ORDER — NICARDIPINE HCL IN NACL 20-0.86 MG/200ML-% IV SOLN
3.0000 mg/h | Freq: Once | INTRAVENOUS | Status: AC
  Administered 2016-02-18: 7.5 mg/h via INTRAVENOUS

## 2016-02-18 MED ORDER — NICARDIPINE HCL IN NACL 20-0.86 MG/200ML-% IV SOLN
INTRAVENOUS | Status: AC
  Filled 2016-02-18: qty 200

## 2016-02-18 NOTE — ED Provider Notes (Signed)
12:52 AM BP160s on cardene drip. Some HA now. No focal deficit. Morphine given. No new abdominal or flank pain. No tachycardia. preparing to transfer via [facility] to [facility], MD 02/18/16 [phone]

## 2016-04-02 ENCOUNTER — Encounter (HOSPITAL_COMMUNITY): Payer: Self-pay | Admitting: *Deleted

## 2016-04-02 ENCOUNTER — Emergency Department (HOSPITAL_COMMUNITY)
Admission: EM | Admit: 2016-04-02 | Discharge: 2016-04-02 | Disposition: A | Discharge status: Home / Self Care | Payer: BLUE CROSS/BLUE SHIELD | Attending: Emergency Medicine | Admitting: Emergency Medicine

## 2016-04-02 ENCOUNTER — Emergency Department (HOSPITAL_COMMUNITY): Payer: BLUE CROSS/BLUE SHIELD

## 2016-04-02 DIAGNOSIS — R112 Nausea with vomiting, unspecified: Secondary | ICD-10-CM

## 2016-04-02 DIAGNOSIS — I10 Essential (primary) hypertension: Secondary | ICD-10-CM | POA: Diagnosis not present

## 2016-04-02 DIAGNOSIS — R1013 Epigastric pain: Secondary | ICD-10-CM | POA: Diagnosis not present

## 2016-04-02 DIAGNOSIS — Z79899 Other long term (current) drug therapy: Secondary | ICD-10-CM | POA: Diagnosis not present

## 2016-04-02 DIAGNOSIS — Z7982 Long term (current) use of aspirin: Secondary | ICD-10-CM | POA: Insufficient documentation

## 2016-04-02 HISTORY — DX: Abdominal aortic aneurysm, without rupture: I71.4

## 2016-04-02 HISTORY — DX: Abdominal aortic aneurysm, without rupture, unspecified: I71.40

## 2016-04-02 LAB — COMPREHENSIVE METABOLIC PANEL
ALT: 22 U/L (ref 14–54)
ANION GAP: 10 (ref 5–15)
AST: 28 U/L (ref 15–41)
Albumin: 3.5 g/dL (ref 3.5–5.0)
Alkaline Phosphatase: 63 U/L (ref 38–126)
BILIRUBIN TOTAL: 0.8 mg/dL (ref 0.3–1.2)
CALCIUM: 10.8 mg/dL — AB (ref 8.9–10.3)
CO2: 25 mmol/L (ref 22–32)
CREATININE: 1.36 mg/dL — AB (ref 0.44–1.00)
Chloride: 106 mmol/L (ref 101–111)
GFR, EST AFRICAN AMERICAN: 52 mL/min — AB (ref >60)
GFR, EST NON AFRICAN AMERICAN: 45 mL/min — AB (ref >60)
GLUCOSE: 105 mg/dL — AB (ref 65–99)
POTASSIUM: 3.1 mmol/L — AB (ref 3.5–5.1)
Sodium: 141 mmol/L (ref 135–145)
Total Protein: 8.5 g/dL — ABNORMAL HIGH (ref 6.5–8.1)

## 2016-04-02 LAB — CBC
HCT: 38.6 % (ref 36.0–46.0)
HEMOGLOBIN: 12.9 g/dL (ref 12.0–15.0)
MCH: 27.2 pg (ref 26.0–34.0)
MCHC: 33.4 g/dL (ref 30.0–36.0)
MCV: 81.4 fL (ref 78.0–100.0)
Platelets: 309 10*3/uL (ref 150–400)
RBC: 4.74 MIL/uL (ref 3.87–5.11)
RDW: 16.1 % — ABNORMAL HIGH (ref 11.5–15.5)
WBC: 8.9 10*3/uL (ref 4.0–10.5)

## 2016-04-02 LAB — URINALYSIS, ROUTINE W REFLEX MICROSCOPIC
BILIRUBIN URINE: NEGATIVE
Glucose, UA: NEGATIVE mg/dL
HGB URINE DIPSTICK: NEGATIVE
Ketones, ur: NEGATIVE mg/dL
Leukocytes, UA: NEGATIVE
Nitrite: NEGATIVE
PH: 7.5 (ref 5.0–8.0)
Protein, ur: NEGATIVE mg/dL
SPECIFIC GRAVITY, URINE: 1.004 — AB (ref 1.005–1.030)

## 2016-04-02 LAB — I-STAT CG4 LACTIC ACID, ED: LACTIC ACID, VENOUS: 1.59 mmol/L (ref 0.5–1.9)

## 2016-04-02 LAB — LIPASE, BLOOD: Lipase: 131 U/L — ABNORMAL HIGH (ref 11–51)

## 2016-04-02 MED ORDER — SUCRALFATE 1 G PO TABS: 1.0000 g | ORAL_TABLET | Freq: Once | ORAL | Status: DC

## 2016-04-02 MED ORDER — IOPAMIDOL (ISOVUE-300) INJECTION 61%
INTRAVENOUS | Status: AC
  Administered 2016-04-02: 100 mL
  Filled 2016-04-02: qty 100

## 2016-04-02 MED ORDER — GI COCKTAIL ~~LOC~~: 30.0000 mL | Freq: Once | ORAL | Status: DC

## 2016-04-02 MED ORDER — ONDANSETRON 8 MG PO TBDP: 8.0000 mg | ORAL_TABLET | Freq: Three times a day (TID) | ORAL | 0 refills | Status: DC | PRN

## 2016-04-02 MED ORDER — HYDROMORPHONE HCL 1 MG/ML IJ SOLN
1.0000 mg | Freq: Once | INTRAMUSCULAR | Status: AC
  Administered 2016-04-02: 1 mg via INTRAVENOUS
  Filled 2016-04-02: qty 1

## 2016-04-02 MED ORDER — METOCLOPRAMIDE HCL 10 MG PO TABS: 10.0000 mg | ORAL_TABLET | Freq: Four times a day (QID) | ORAL | 0 refills | Status: DC

## 2016-04-02 MED ORDER — SODIUM CHLORIDE 0.9 % IV BOLUS (SEPSIS)
1000.0000 mL | Freq: Once | INTRAVENOUS | Status: AC
  Administered 2016-04-02: 1000 mL via INTRAVENOUS

## 2016-04-02 MED ORDER — ONDANSETRON HCL 4 MG/2ML IJ SOLN
4.0000 mg | Freq: Once | INTRAMUSCULAR | Status: AC
  Administered 2016-04-02: 4 mg via INTRAVENOUS
  Filled 2016-04-02: qty 2

## 2016-04-02 MED ORDER — PANTOPRAZOLE SODIUM 20 MG PO TBEC: 40.0000 mg | DELAYED_RELEASE_TABLET | Freq: Every day | ORAL | 0 refills | Status: DC

## 2016-04-02 MED ORDER — DIATRIZOATE MEGLUMINE & SODIUM 66-10 % PO SOLN
ORAL | Status: AC
  Filled 2016-04-02: qty 30

## 2016-04-02 NOTE — ED Notes (Signed)
Pt c/o severe abd pain started in past few days- worse today

## 2016-04-02 NOTE — ED Notes (Signed)
Pt to Xray -- has oral contrast to drink for CT

## 2016-04-02 NOTE — ED Provider Notes (Signed)
[facility] DEPT Provider Note   CSN: [id number] Arrival date & time: 04/02/16  0945  First Provider Contact:  First MD Initiated Contact with Patient 04/02/16 1015        History   Chief Complaint Chief Complaint  Patient presents with  . Abdominal Pain  . Emesis   HPI   Connie Davis is an 50 y.o. female with history of complicated AAA s/p recent repair 03/15/16 at [facility], h/o GI bleed requiring blood transfusion, GERD, HTN, H. Pylori who presents to the ED for evaluation of abdominal pain and n/v. She states since her discharge from [facility] two weeks ago she has had persistent epigastric pain. The pain was present during her admission and she was told was likely secondary to constipation. She states, however, the pain has progressed and is unbearable. She states it has gradually gotten worse but no acute worsening of pain. The pain does not radiate and is constant. She notes associated nausea and vomiting. She states after discharge she had been able to tolerate small sips of fluid but the past day or two she cannot even tolerate clear liquid diet or sips of tea. She states her last BM was two days ago; she has been taking stool softener and laxative since discharge and had been having one to two bowel movements daily. She denies fever. Endorses chills. She states she feels weak all over. She states she has tried Zofran ODT at home with no relief of her nausea. She states her home oxycodone makes her vomit. Denies diarrhea, urinary symptoms. Denies chest pain or SOB. She is on ASA and BP meds daily, no anticoagulation.   Past Medical History:  Diagnosis Date  . Abdominal aneurysm ([place])   . GERD (gastroesophageal reflux disease)   . Hypertension     Patient Active Problem List   Diagnosis Date Noted  . Postoperative state 01/01/2014  . S/P hysterectomy- [place] 01/01/2014    Past Surgical History:  Procedure Laterality Date  . BILATERAL SALPINGECTOMY Bilateral 01/01/2014   Procedure: BILATERAL SALPINGECTOMY;  Surgeon: Princess Bruins, MD;  Location: [facility];  Service: Gynecology;  Laterality: Bilateral;  . NO PAST SURGERIES    . ROBOTIC ASSISTED TOTAL HYSTERECTOMY N/A 01/01/2014   Procedure: ROBOTIC ASSISTED TOTAL HYSTERECTOMY;  Surgeon: Princess Bruins, MD;  Location: [facility];  Service: Gynecology;  Laterality: N/A;    OB History    No data available       Home Medications    Prior to Admission medications   Medication Sig Start Date End Date Taking? Authorizing Provider  acetaminophen (TYLENOL) 500 MG tablet Take 1 tablet (500 mg total) by mouth every 6 (six) hours as needed for mild pain or moderate pain. Patient not taking: Reported on 02/17/2016 02/11/16   Antonietta Breach, PA-C  aspirin EC 81 MG tablet Take 81 mg by mouth daily.    Historical Provider, MD  dexlansoprazole (DEXILANT) 60 MG capsule Take 60 mg by mouth 2 (two) times daily.    Historical Provider, MD  HYDROcodone-acetaminophen (NORCO/VICODIN) 5-325 MG tablet Take 1 tablet by mouth every 6 (six) hours as needed for moderate pain. 02/17/16   Milton Ferguson, MD  hydrocodone-ibuprofen (VICOPROFEN) 5-200 MG per tablet Take 1 tablet by mouth every 8 (eight) hours as needed for pain. Patient not taking: Reported on 02/11/2016 01/02/14   Princess Bruins, MD  losartan (COZAAR) 25 MG tablet Take 50 mg by mouth daily.     Historical Provider, MD  metoprolol (LOPRESSOR) 50 MG  tablet Take 50 mg by mouth 2 (two) times daily.    Historical Provider, MD  ondansetron (ZOFRAN ODT) 4 MG disintegrating tablet 94m ODT q4 hours prn nausea/vomit 02/17/16   JMilton Ferguson MD  pantoprazole (PROTONIX) 20 MG tablet Take 1 tablet (20 mg total) by mouth daily. Patient not taking: Reported on 02/17/2016 02/11/16   KAntonietta Breach PA-C  Propylene Glycol (SYSTANE BALANCE OP) Place 1 drop into both eyes daily as needed. For dry eyes    Historical Provider, MD  sucralfate (CARAFATE) 1 GM/10ML suspension Take 10 mLs (1 g total) by mouth  4 (four) times daily -  with meals and at bedtime. Patient not taking: Reported on 02/17/2016 02/11/16   KAntonietta Breach PA-C    Family History History reviewed. No pertinent family history.  Social History Social History  Substance Use Topics  . Smoking status: Never Smoker  . Smokeless tobacco: Not on file  . Alcohol use No     Allergies   Review of patient's allergies indicates no known allergies.   Review of Systems Review of Systems 10 Systems reviewed and are negative for acute change except as noted in the HPI.   Physical Exam Updated Vital Signs BP (!) 121/102 (BP Location: Left Arm)   Pulse 91   Temp 98.4 F (36.9 C) (Oral)   Resp 22   LMP 12/22/2013   SpO2 97%   Physical Exam  Constitutional: She is oriented to person, place, and time.  Appears very uncomfortable. Slowly writhing in bed. At points becomes slightly tachypneic.   HENT:  Right Ear: External ear normal.  Left Ear: External ear normal.  Nose: Nose normal.  Mouth/Throat: No oropharyngeal exudate.  MM dry  Eyes: Conjunctivae are normal.  Neck: Normal range of motion. Neck supple.  Cardiovascular: Normal rate, regular rhythm, normal heart sounds and intact distal pulses.   Pulmonary/Chest: Effort normal and breath sounds normal. No respiratory distress. She has no wheezes.  Abdominal:  Abdomen soft, nondistended. Well healing midline surgical scar. Marked epigastric tenderness with some involuntary guarding. No other abdominal tenderness. Bowel sounds present x 4 quadrants. Pain is worse with movement.  Musculoskeletal: She exhibits no edema.  No LE edema or calf tenderness  Lymphadenopathy:    She has no cervical adenopathy.  Neurological: She is alert and oriented to person, place, and time. No cranial nerve deficit.  Skin: Skin is warm and dry.  Nursing note and vitals reviewed.    ED Treatments / Results  Labs (all labs ordered are listed, but only abnormal results are displayed) Labs  Reviewed  LIPASE, BLOOD - Abnormal; Notable for the following:       Result Value   Lipase 131 (*)    All other components within normal limits  COMPREHENSIVE METABOLIC PANEL - Abnormal; Notable for the following:    Potassium 3.1 (*)    Glucose, Bld 105 (*)    BUN <5 (*)    Creatinine, Ser 1.36 (*)    Calcium 10.8 (*)    Total Protein 8.5 (*)    GFR calc non Af Amer 45 (*)    GFR calc Af Amer 52 (*)    All other components within normal limits  CBC - Abnormal; Notable for the following:    RDW 16.1 (*)    All other components within normal limits  URINALYS[facility], [facility] - [facility]normal; Notable for the following:    Specific Gravity, Urine 1.004 (*)  All other components within normal limits  I-STAT CG4 LACTIC ACID, ED    EKG  EKG Interpretation None       Radiology Ct Abdomen Pelvis W Contrast  Result Date: 04/02/2016 CLINICAL DATA:  50 year old female with increasing abdominal pain, nausea and vomiting status post aneurysm repair at an outside facility on 03/10/2016 EXAM: CT ABDOMEN AND PELVIS WITH CONTRAST TECHNIQUE: Multidetector CT imaging of the abdomen and pelvis was performed using the standard protocol following bolus administration of intravenous contrast. CONTRAST:  142m ISOVUE-300 IOPAMIDOL (ISOVUE-300) INJECTION 61% COMPARISON:  None. Prior CTA of the abdomen and pelvis 02/17/2016 FINDINGS: Lower chest: The lung bases are clear. Visualized cardiac structures are within normal limits for size. No pericardial effusion. Unremarkable visualized distal thoracic esophagus. Hepatobiliary: No masses or other significant abnormality. Gallbladder is unremarkable. No intra or extrahepatic biliary ductal dilatation. Pancreas: No mass, inflammatory changes, or other significant abnormality. Spleen: Within normal limits in size and appearance. Adrenals/Urinary Tract: Both adrenal glands are unremarkable. Persistent left hydronephrosis. This is likely  secondary to external compression of the ureter secondary to the complex fluid collection along the left pelvic sidewall. No significant left renal perfusion abnormality to suggest pyelonephritis is. Dysplastic antral lateral aspect of the right kidney with multiple small cysts is similar compared to prior and likely congenital. No enhancing renal mass or nephrolithiasis. Stomach/Bowel: No evidence of obstruction or focal bowel wall thickening. Normal appendix in the right lower quadrant. The terminal ileum is unremarkable. Vascular/Lymphatic: Surgical changes of aorto bi-iliac bypass graft. There is heterogeneous fluid attenuation surrounding the distal aspect of the graft and the proximal left iliac limb likely representing residual liquified hematoma from the prior aneurysm sac versus postoperative seroma. Additionally, this could represent an omental wrap about the surgical site. There is a new relatively high attenuation fluid collection in the left retroperitoneum tracking along the antral lateral margin of the psoas muscle and into the pelvic sidewall which measures at least 10.5 by 4.1 x 3.5 cm. The epicenter of this collection is in the region of the left iliac bifurcation likely at the distal anastomosis site. Reproductive: Surgical changes of prior hysterectomy. Low-attenuation fluid within the pelvic is likely physiologic Other: None. Musculoskeletal: No acute fracture or aggressive appearing lytic or blastic osseous lesion. IMPRESSION: 1. Interval surgical changes of aorto bi-iliac bypass graft extending into the proximal iliac artery on the right in the distal iliac artery just proximal to the bifurcation on the left. 2. There is an irregular intermediate attenuation complex fluid collection originating in the left pelvic sidewall immediately anterior to the left iliac anastomosis which then extends slightly inferiorly along the pelvic sidewall and superiorly along the anterolateral aspect of the psoas  muscle. The appearance is most suggestive of a postoperative hematoma. Urinoma is a consideration but felt less likely given that the collection is not simple fluid. There is some surrounding inflammatory stranding. The sterility of this fluid collection cannot be confirmed radiographically although there is no internal gas. No definite active hemorrhage is identified although evaluation is limited given non arterial phase timing of the study. 3. Fluid collection around the distal abdominal graft and left iliac limb likely represents either liquified hematoma from the now excluded prior aneurysm sac, postoperative seroma or potentially an omental wrap placed at the time of surgery. The fluid collection has similar contours compared to the aneurysm preoperatively and residual fluid within the prior aneurysm sac (not unexpected in the subacute postoperative time frame) is favored. Infection is difficult to  exclude radiographically. If there is clinical concern for infection, consider correlation with serum ESR and CRP. A tagged white blood cell nuclear medicine study would be the imaging test of choice for further evaluation if warranted. 4. Persistent and unchanged left hydronephrosis secondary to external compression of the left ureter as it passes through the left pelvic sidewall complex fluid collection. 5. Stable appearance of focal atrophy, decreased perfusion and multiple small cysts in the anterolateral aspect of the right interpolar and lower pole kidney. This is either congenital (partial dysplastic kidney) or secondary to chronic ischemia perhaps from an occluded accessory renal artery. 6. Trace free fluid in the pelvis is likely physiologic. These results were called by telephone at the time of interpretation on 04/02/2016 at 2:35 pm to Dr. Delrae Rend , who verbally acknowledged these results. Signed, Criselda Peaches, MD [facility]  Electronically Signed   By: Jacqulynn Cadet M.D.   On: 04/02/2016 14:36   Dg Abdomen Acute W/chest  Result Date: 04/02/2016 CLINICAL DATA:  Recent abdominal surgery. Pain around incision site. Nausea and vomiting. EXAM: DG ABDOMEN ACUTE W/ 1V CHEST COMPARISON:  02/17/2016 FINDINGS: There is no evidence of dilated bowel loops or free intraperitoneal air. No radiopaque calculi or other significant radiographic abnormality is seen. Heart size and mediastinal contours are within normal limits. Both lungs are clear. IMPRESSION: Negative abdominal radiographs.  No acute cardiopulmonary disease. Electronically Signed   By: Kerby Moors M.D.   On: 04/02/2016 11:19    Procedures Procedures (including critical care time)  Medications Ordered in ED Medications  diatrizoate meglumine-sodium (GASTROGRAFIN) 66-10 % solution (not administered)  sodium chloride 0.9 % bolus 1,000 mL (0 mLs Intravenous Stopped 04/02/16 1248)  HYDROmorphone (DILAUDID) injection 1 mg (1 mg Intravenous Given 04/02/16 1034)  ondansetron (ZOFRAN) injection 4 mg (4 mg Intravenous Given 04/02/16 1034)  iopamidol (ISOVUE-300) 61 % injection (100 mLs  Contrast Given 04/02/16 1253)     Initial Impression / Assessment and Plan / ED Course  I have reviewed the triage vital signs and the nursing notes.  Pertinent labs & imaging results that were available during my care of the patient were reviewed by me and considered in my medical decision making (see chart for details).  Clinical Course    10:40 AM Case discussed with Dr. Johnney Killian, attending physician, who also saw and evaluated pt. At this point etiology is more likely GI rather than acute rupture or other vascular etiology. CBC, CMP, lipase, lactic acid, UA ordered and are pending. Care Everywhere reviewed and most recent imaging reviewed. We will obtain an acute abdominal series x-ray and CT abd/pelvis with contrast. Pain meds, anti-emetics, and fluids ordered.  2:34 PM  I spoke  with Dr. Laurence Ferrari of radiology regarding pt's case. CT with many post-surgical changes and much fluid. Difficult to exclude with certainty any infection. However, with no fever or elevated white count an infection would be considered less likely. With history of persistent, progressive pain over the past two weeks suspect possible stress-related peptic ulcer disease. More likely GI etiology than vascular.  3:43 PM I spoke with Dr. Nevada Crane of Vascular Surgery in [facility]. Reviewed CT findings with him. Agree pt's pain and N/V likely GI. Suspect postoperative findings on CT. Instructed to f/u with vascular clinic this week. I discussed this plan with pt and her husband. They are in agreement. Rx given for zofran, reglan, and PPI. Pt's pain and nausea improved. Encouraged follow up with GI  and PCP as well. ER return precautions given.  Final Clinical Impressions(s) / ED Diagnoses   Final diagnoses:  Epigastric pain  Non-intractable vomiting with nausea, vomiting of unspecified type    New Prescriptions New Prescriptions   METOCLOPRAMIDE (REGLAN) 10 MG TABLET    Take 1 tablet (10 mg total) by mouth every 6 (six) hours.   ONDANSETRON (ZOFRAN-ODT) 8 MG DISINTEGRATING TABLET    Take 1 tablet (8 mg total) by mouth every 8 (eight) hours as needed for nausea or vomiting.   PANTOPRAZOLE (PROTONIX) 20 MG TABLET    Take 2 tablets (40 mg total) by mouth daily.     Anne Ng, PA-C 04/02/16 [place], MD 04/05/16 [phone]

## 2016-04-02 NOTE — ED Notes (Signed)
Pt had concern regarding abd pain and continued vomiting-  Education on Rx, diet and physician follow up.

## 2016-04-02 NOTE — ED Triage Notes (Signed)
Pt reports having aneursym repair at [facility] on 7/14 and having increase in abd pain and n/v. Denies diarrhea. Appears in pain at triage.

## 2016-04-02 NOTE — Discharge Instructions (Signed)
Take medications as prescribed. Please call [facility] to schedule a follow up appointment as soon as possible.

## 2016-04-04 DIAGNOSIS — E870 Hyperosmolality and hypernatremia: Secondary | ICD-10-CM | POA: Insufficient documentation

## 2016-04-04 DIAGNOSIS — R112 Nausea with vomiting, unspecified: Secondary | ICD-10-CM | POA: Insufficient documentation

## 2017-02-23 ENCOUNTER — Telehealth: Payer: Self-pay | Admitting: Internal Medicine

## 2017-02-23 NOTE — Telephone Encounter (Signed)
Received referral to schedule colonoscopy. Patient states that she saw another GI in the hospital when she was on vacation at [facility]. She says that our office is closer to her and is requesting to be seen here.   Dr. Carlean Purl is Doc of the Day for 02/21/17 PM. Records placed on his desk for review.

## 2017-02-27 ENCOUNTER — Encounter: Payer: Self-pay | Admitting: Internal Medicine

## 2017-02-27 NOTE — Telephone Encounter (Signed)
Dr. Carlean Purl reviewed records and has accepted patient. Ok to schedule Direct colon. Colonoscopy scheduled.

## 2017-04-11 ENCOUNTER — Ambulatory Visit (AMBULATORY_SURGERY_CENTER): Payer: Self-pay

## 2017-04-11 VITALS — Ht 67.0 in | Wt 151.8 lb

## 2017-04-11 DIAGNOSIS — Z1211 Encounter for screening for malignant neoplasm of colon: Secondary | ICD-10-CM

## 2017-04-11 MED ORDER — SUPREP BOWEL PREP KIT 17.5-3.13-1.6 GM/177ML PO SOLN: 1.0000 | Freq: Once | ORAL | 0 refills | Status: AC

## 2017-04-11 NOTE — Progress Notes (Signed)
No allergies to eggs or soy No diet meds No home oxygen No past problems with anesthesia  Registered emmi 

## 2017-05-02 ENCOUNTER — Encounter: Payer: Self-pay | Admitting: Internal Medicine

## 2017-05-02 ENCOUNTER — Ambulatory Visit (AMBULATORY_SURGERY_CENTER): Payer: Self-pay | Admitting: Internal Medicine

## 2017-05-02 VITALS — BP 120/54 | HR 58 | Temp 97.8°F | Resp 18 | Ht 67.0 in | Wt 151.0 lb

## 2017-05-02 DIAGNOSIS — Z1211 Encounter for screening for malignant neoplasm of colon: Secondary | ICD-10-CM

## 2017-05-02 MED ORDER — SODIUM CHLORIDE 0.9 % IV SOLN: 500.0000 mL | INTRAVENOUS | Status: DC

## 2017-05-02 NOTE — Op Note (Signed)
[place] Patient Name: Connie Davis Procedure Date: 05/02/2017 10:41 AM MRN: [id number] Endoscopist: Gatha Mayer , MD Age: 51 Referring MD:  Date of Birth: July 28, 1966 Gender: Female Account #: [id number] Procedure:                Colonoscopy Indications:              Screening for colorectal malignant neoplasm, This                            is the patient's first colonoscopy Medicines:                Propofol per Anesthesia, Monitored Anesthesia Care Procedure:                Pre-Anesthesia Assessment:                           - Prior to the procedure, a History and Physical                            was performed, and patient medications and                            allergies were reviewed. The patient's tolerance of                            previous anesthesia was also reviewed. The risks                            and benefits of the procedure and the sedation                            options and risks were discussed with the patient.                            All questions were answered, and informed consent                            was obtained. Prior Anticoagulants: The patient has                            taken no previous anticoagulant or antiplatelet                            agents. ASA Grade Assessment: II - A patient with                            mild systemic disease. After reviewing the risks                            and benefits, the patient was deemed in                            satisfactory condition to undergo the procedure.  After obtaining informed consent, the colonoscope                            was passed under direct vision. Throughout the                            procedure, the patient's blood pressure, pulse, and                            oxygen saturations were monitored continuously. The                            Model PCF-[id number]) scope was introduced   through the anus and advanced to the the cecum,                            identified by appendiceal orifice and ileocecal                            valve. The colonoscopy was performed without                            difficulty. The patient tolerated the procedure                            well. The quality of the bowel preparation was                            good. The ileocecal valve, appendiceal orifice, and                            rectum were photographed. The bowel preparation                            used was Miralax. Scope In: 10:51:24 AM Scope Out: 11:03:52 AM Scope Withdrawal Time: 0 hours 10 minutes 4 seconds  Total Procedure Duration: 0 hours 12 minutes 28 seconds  Findings:                 The perianal and digital rectal examinations were                            normal.                           The entire examined colon appeared normal on direct                            and retroflexion views. Complications:            No immediate complications. Estimated Blood Loss:     Estimated blood loss: none. Impression:               - The entire examined colon is normal on direct and  retroflexion views.                           - No specimens collected. Recommendation:           - Patient has a contact number available for                            emergencies. The signs and symptoms of potential                            delayed complications were discussed with the                            patient. Return to normal activities tomorrow.                            Written discharge instructions were provided to the                            patient.                           - Resume previous diet.                           - Continue present medications.                           - Repeat colonoscopy in 10 years for screening                            purposes. Gatha Mayer, MD 05/02/2017 11:08:00 AM This report has been  signed electronically.

## 2017-05-02 NOTE — Progress Notes (Signed)
Pt's states no medical or surgical changes since previsit or office visit. 

## 2017-05-02 NOTE — Patient Instructions (Addendum)
   Normal colonoscopy today - no polyps or cancer seen!  Next routine colonoscopy or other screening test in 10 years - 2028  I appreciate the opportunity to care for you. Gatha Mayer, MD, FACG  YOU HAD AN ENDOSCOPIC PROCEDURE TODAY AT [facility]:   Refer to the procedure report that was given to you for any specific questions about what was found during the examination.  If the procedure report does not answer your questions, please call your gastroenterologist to clarify.  If you requested that your care partner not be given the details of your procedure findings, then the procedure report has been included in a sealed envelope for you to review at your convenience later.  YOU SHOULD EXPECT: Some feelings of bloating in the abdomen. Passage of more gas than usual.  Walking can help get rid of the air that was put into your GI tract during the procedure and reduce the bloating. If you had a lower endoscopy (such as a colonoscopy or flexible sigmoidoscopy) you may notice spotting of blood in your stool or on the toilet paper. If you underwent a bowel prep for your procedure, you may not have a normal bowel movement for a few days.  Please Note:  You might notice some irritation and congestion in your nose or some drainage.  This is from the oxygen used during your procedure.  There is no need for concern and it should clear up in a day or so.  SYMPTOMS TO REPORT IMMEDIATELY:   Following lower endoscopy (colonoscopy or flexible sigmoidoscopy):  Excessive amounts of blood in the stool  Significant tenderness or worsening of abdominal pains  Swelling of the abdomen that is new, acute  Fever of 100F or higher   For urgent or emergent issues, a gastroenterologist can be reached at any hour by calling [phone].   DIET:  We do recommend a small meal at first, but then you may proceed to your regular diet.  Drink plenty of fluids but you should avoid alcoholic  beverages for 24 hours.  ACTIVITY:  You should plan to take it easy for the rest of today and you should NOT DRIVE or use heavy machinery until tomorrow (because of the sedation medicines used during the test).    FOLLOW UP: Our staff will call the number listed on your records the next business day following your procedure to check on you and address any questions or concerns that you may have regarding the information given to you following your procedure. If we do not reach you, we will leave a message.  However, if you are feeling well and you are not experiencing any problems, there is no need to return our call.  We will assume that you have returned to your regular daily activities without incident.  If any biopsies were taken you will be contacted by phone or by letter within the next 1-3 weeks.  Please call us at [phone] if you have not heard about the biopsies in 3 weeks.    SIGNATURES/CONFIDENTIALITY: You and/or your care partner have signed paperwork which will be entered into your electronic medical record.  These signatures attest to the fact that that the information above on your After Visit Summary has been reviewed and is understood.  Full responsibility of the confidentiality of this discharge information lies with you and/or your care-partner.

## 2017-05-02 NOTE — Progress Notes (Signed)
To PACU VSS. Repor to [email or web address]

## 2017-05-03 ENCOUNTER — Telehealth: Payer: Self-pay | Admitting: *Deleted

## 2017-05-03 NOTE — Telephone Encounter (Signed)
No answer, second call.  Left message to call if questions concerns.

## 2017-06-18 IMAGING — CT CT ABD-PELV W/O CM
2 of 4 series · 15 of 46 positions shown, 17 images · non-contrast
Comparison: Chest CT 02/11/2016

CLINICAL DATA: Patient with epigastric pain for 2 weeks. Recent
weight loss.

EXAM:
CT ABDOMEN AND PELVIS WITHOUT CONTRAST
TECHNIQUE: Multidetector CT imaging of the abdomen and pelvis was performed
following the standard protocol without IV contrast.

[Series 2: abd/ pelvis 5.0 i30f 1 · axial · 0.68mm/px · z∈[+688,+1113]mm · 12 of 97 slices shown, 14 images]
[im 8/97  soft-tissue]
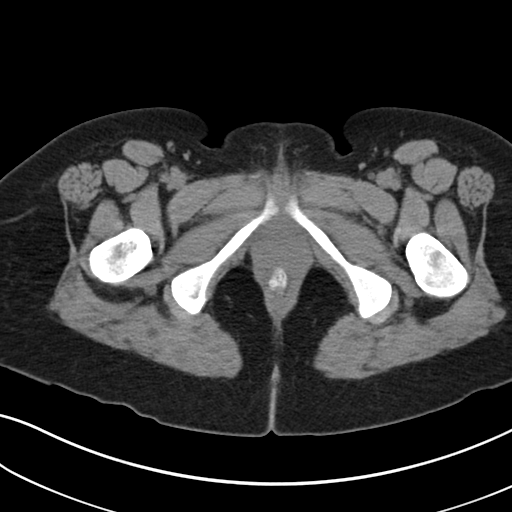
[im 8/97  bone]
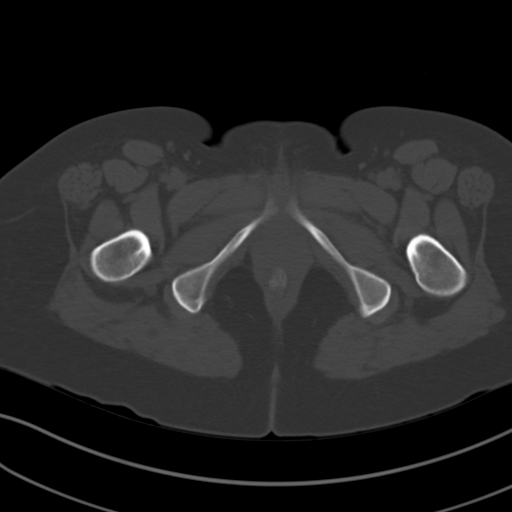
[im 16/97  soft-tissue]
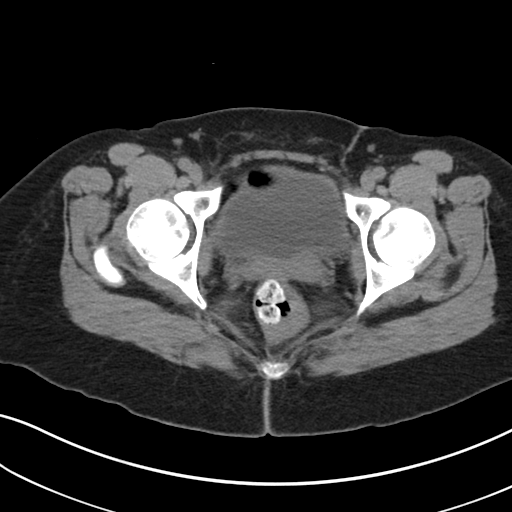
[im 24/97  soft-tissue]
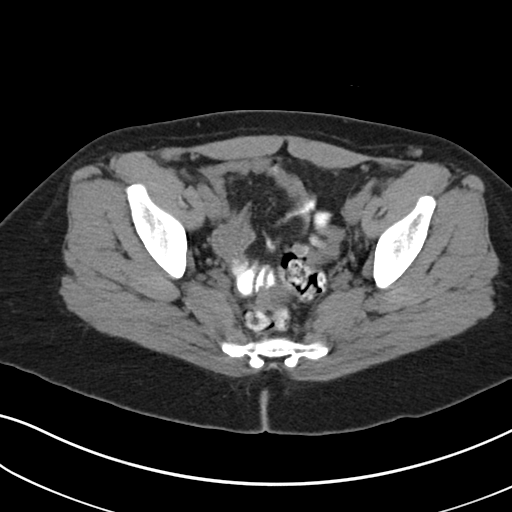
[im 31/97  soft-tissue]
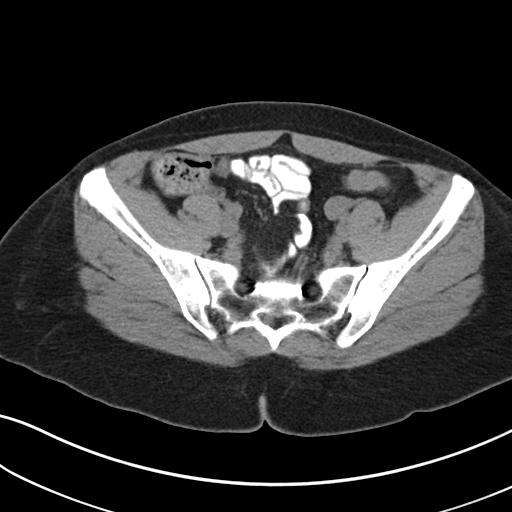
[im 39/97  soft-tissue]
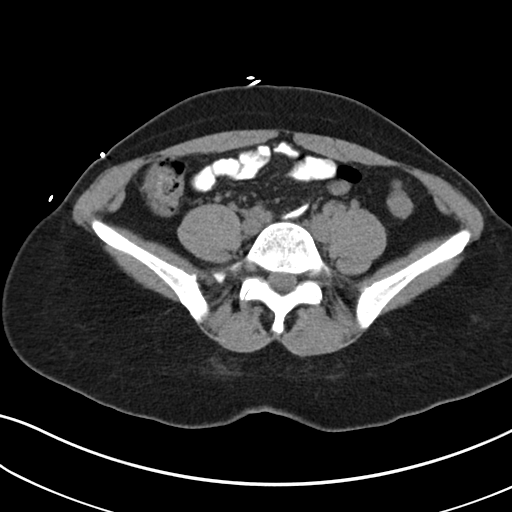
[im 47/97  soft-tissue]
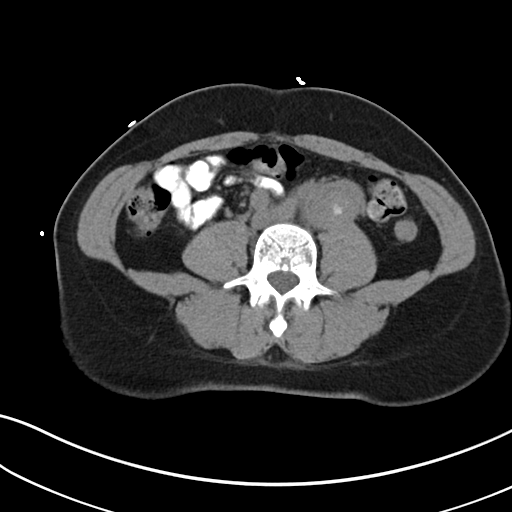
[im 54/97  soft-tissue]
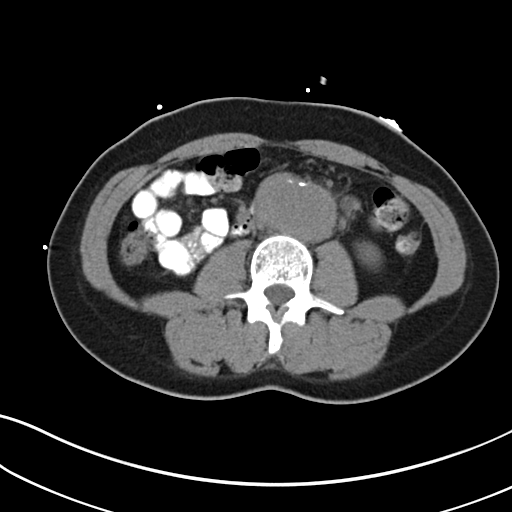
[im 62/97  soft-tissue]
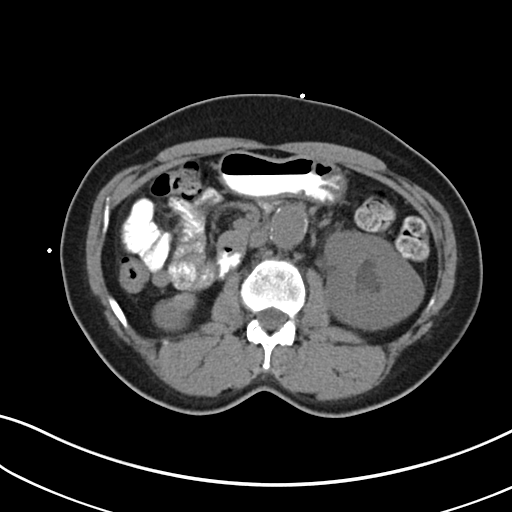
[im 70/97  soft-tissue]
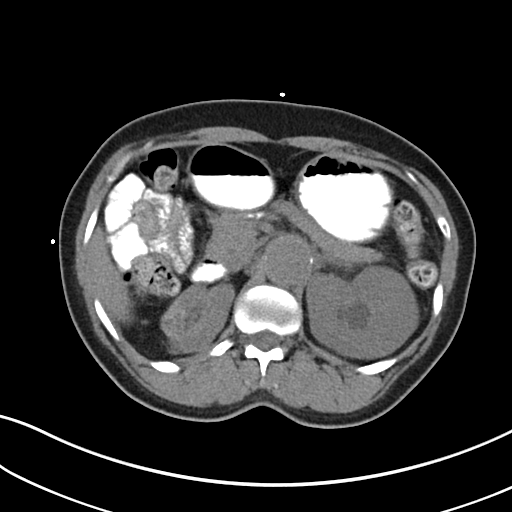
[im 70/97  bone]
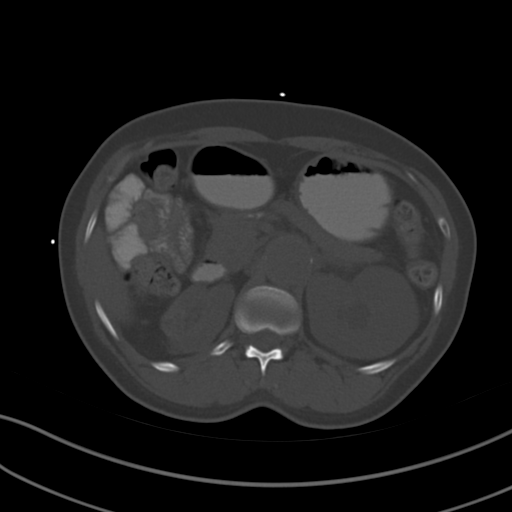
[im 77/97  soft-tissue]
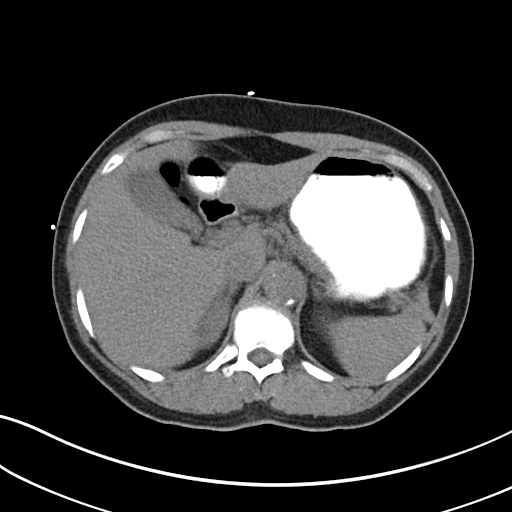
[im 85/97  soft-tissue]
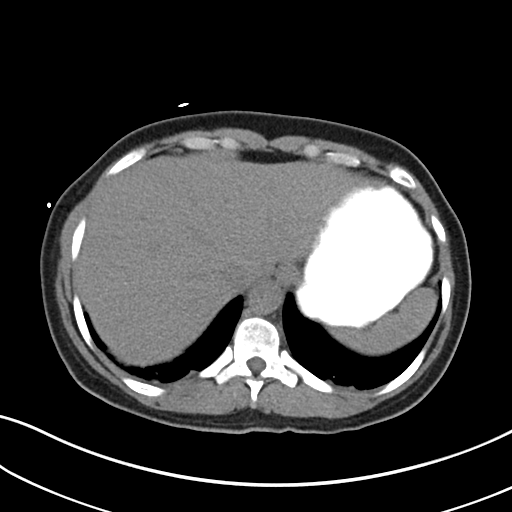
[im 93/97  soft-tissue]
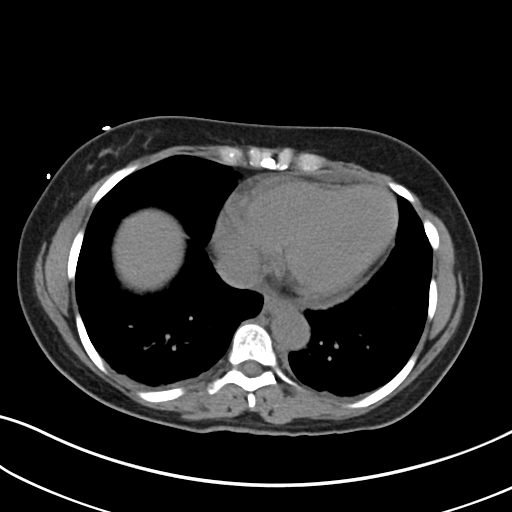

[Series 5: cor st · coronal · 0.71mm/px · 3 of 80 slices shown]
[im 27/80  soft-tissue]
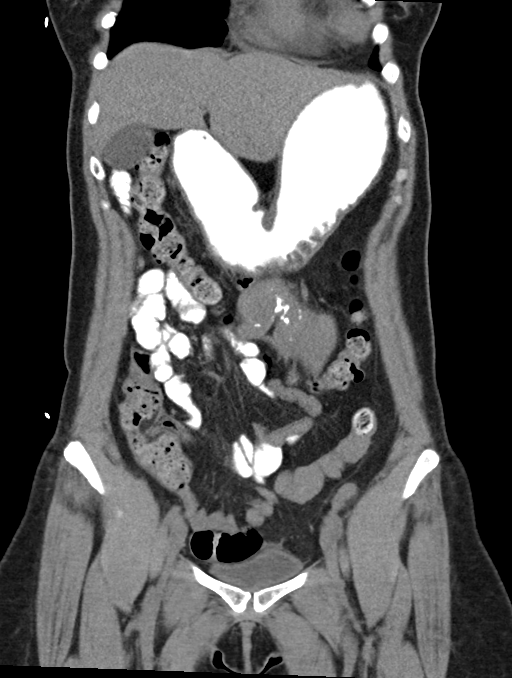
[im 36/80  soft-tissue]
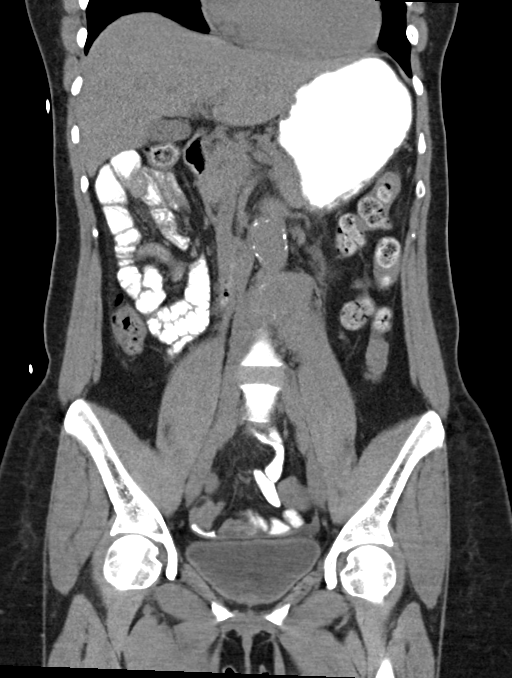
[im 44/80  soft-tissue]
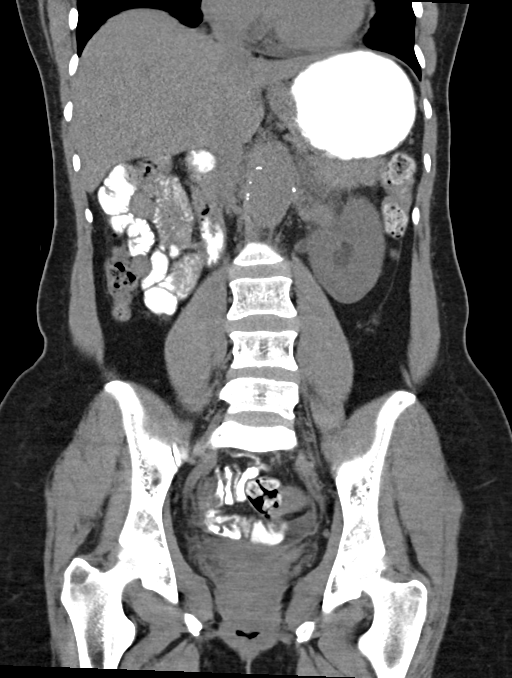

[15 of 46 positions shown; findings below may reference images not displayed]

FINDINGS: Lower chest: Small pericardial effusion. Dependent atelectasis
within the bilateral lower lobes. No definite pleural effusion.

Hepatobiliary: Liver is low in attenuation compatible with
steatosis. Small amount of layering sludge and/or stones within the
gallbladder lumen.

Pancreas: Unremarkable

Spleen: Unremarkable

Adrenals/Urinary Tract: Evaluation of the kidneys is limited without
intravenous contrast material. There is suggestion of a possible
right renal mass measuring 5.1 x 3.0 cm (image 31; series 2). The
left kidney is enlarged. There is mild left hydronephrosis to the
level of the mid ureter.

Stomach/Bowel: No abnormal bowel wall thickening or evidence for
bowel obstruction. No free intraperitoneal air.

Vascular/Lymphatic: Abdominal aortic aneurysm measuring 3.6 x 3.5 cm
(image 29; series 2). At the level of the aortic bifurcation there
is aneurysmal dilatation of the aorta measuring up to 5 cm. The left
common iliac artery is aneurysmal measuring 3.8 cm. There is soft
tissue fat stranding surrounding the aneurysmal dilatation of the
distal abdominal aorta and left common iliac artery. There is small
amount of atherosclerotic calcification which appears more centrally
located within the left common iliac artery. No retroperitoneal
lymphadenopathy.

Other: Small amount of free fluid in the pelvis.

Musculoskeletal: No aggressive or acute appearing osseous lesions.
IMPRESSION: There is aneurysmal dilatation of the mid abdominal aorta as well as
the distal abdominal aorta at the level of the bifurcation. The
aneurysm at the bifurcation extends into the left and right common
iliac arteries. There is irregularity of the overlying calcified
atherosclerotic plaque and fat stranding about the distal abdominal
aorta. Given lack of intravenous contrast material and no priors for
comparison, considerations include a mycotic aneurysm or possibly
rapidly enlarging aneurysm with impending rupture. Consider further
evaluation with contrast-enhanced examination as well as a vascular
surgery consultation.

Enlargement of the left kidney with moderate left
hydroureteronephrosis to the level of the mid ureter at the area of
inflammation surrounding the distal abdominal aorta/common iliac
artery.

Small amount of free fluid in the pelvis.

Cannot exclude the possibility of a 5 cm right renal mass given the
appearance on noncontrast enhanced examination. This needs dedicated
evaluation with contrast-enhanced CT or MRI in the non acute
setting.

Critical Value/emergent results were called by telephone at the time
of interpretation on 02/17/2016 at [DATE] to Dr. Joshjax, who
verbally acknowledged these results.

## 2017-06-18 IMAGING — CT CT CTA ABD/PEL W/CM AND/OR W/O CM
2 of 7 series · 13 of 46 positions shown, 15 images · IV contrast (isovue)
Comparison: COMPARISON
Noncontrast study from earlier the same day

CLINICAL DATA: Iliac aneurysm.  Renal insufficiency.

EXAM:
CT ANGIOGRAPHY ABDOMEN AND PELVIS
TECHNIQUE: Multidetector CT imaging of the abdomen and pelvis was performed
using the standard protocol during bolus administration of
intravenous contrast. Multiplanar reconstructed images including
MIPs were obtained and reviewed to evaluate the vascular anatomy.
CONTRAST:  60 mL Isovue 370 IV

[Series 5: dissection 2mm · axial · 0.65mm/px · z∈[-834,-324]mm · 10 of 285 slices shown, 12 images]
[im 15/285  soft-tissue]
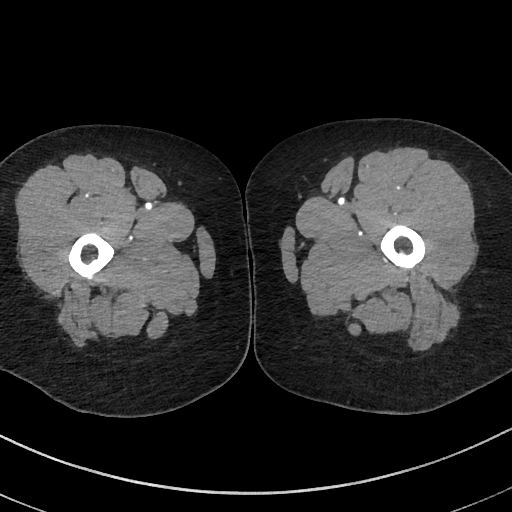
[im 15/285  bone]
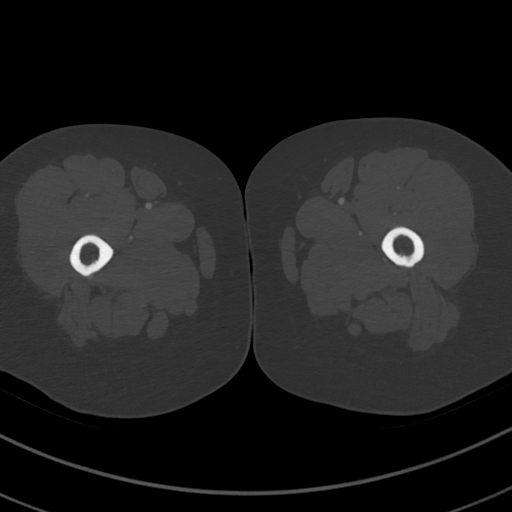
[im 43/285  soft-tissue]
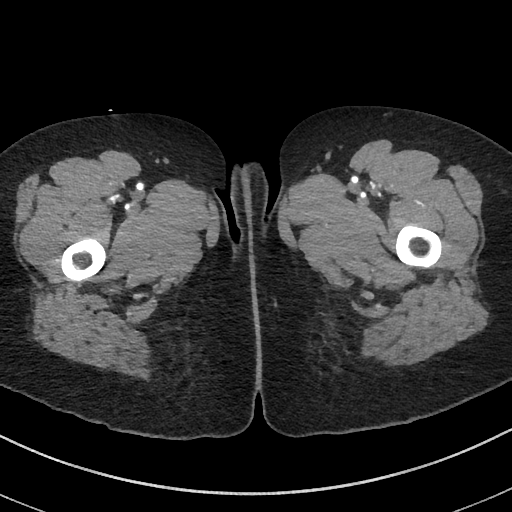
[im 72/285  soft-tissue]
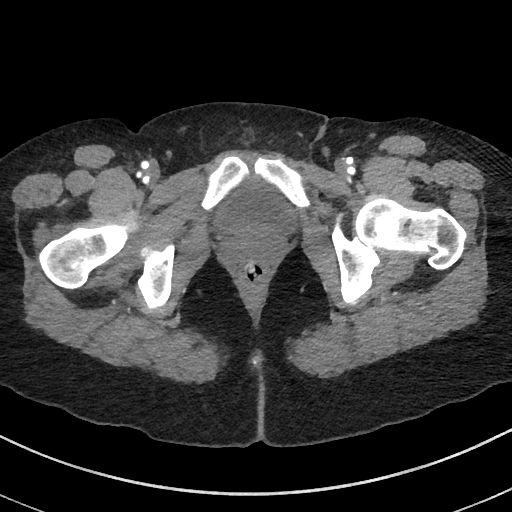
[im 100/285  soft-tissue]
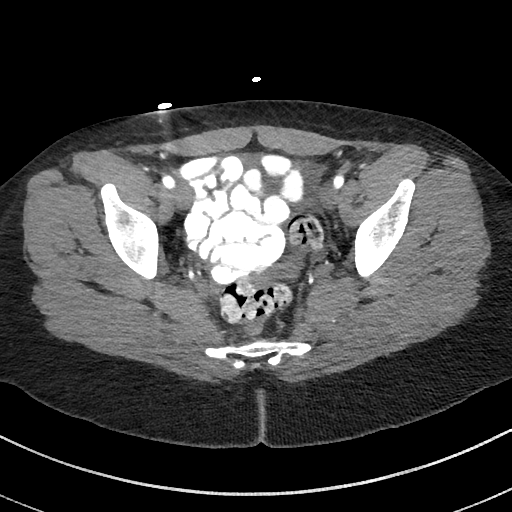
[im 128/285  soft-tissue]
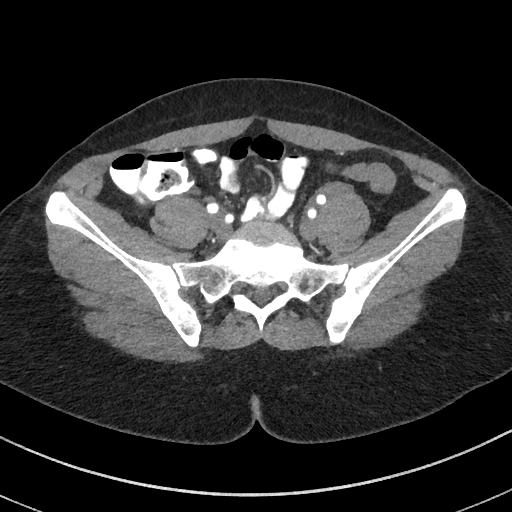
[im 157/285  soft-tissue]
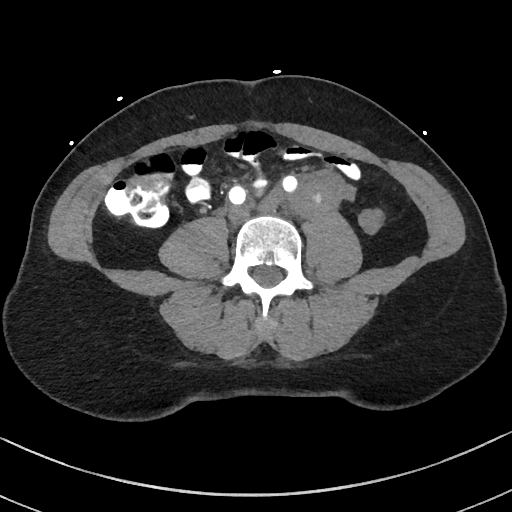
[im 185/285  soft-tissue]
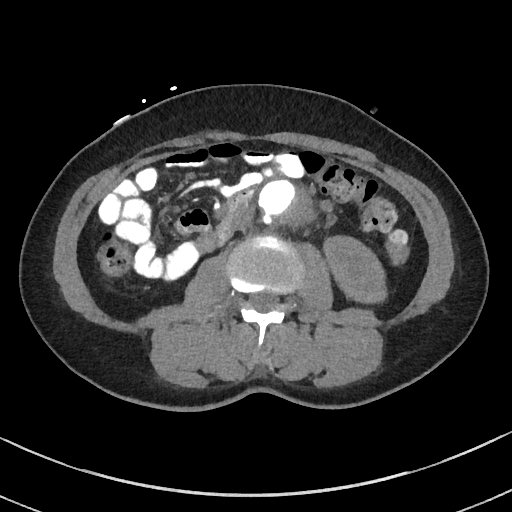
[im 214/285  soft-tissue]
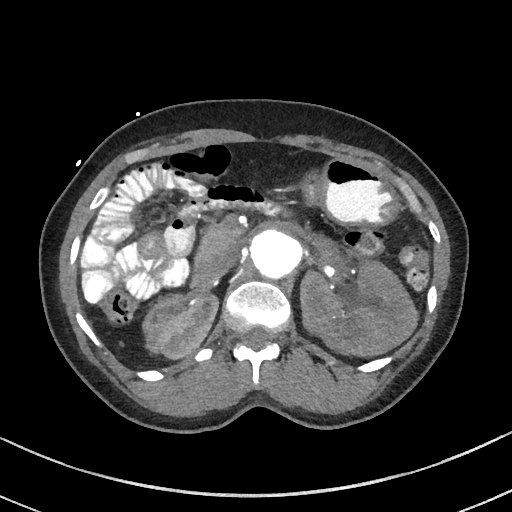
[im 242/285  soft-tissue]
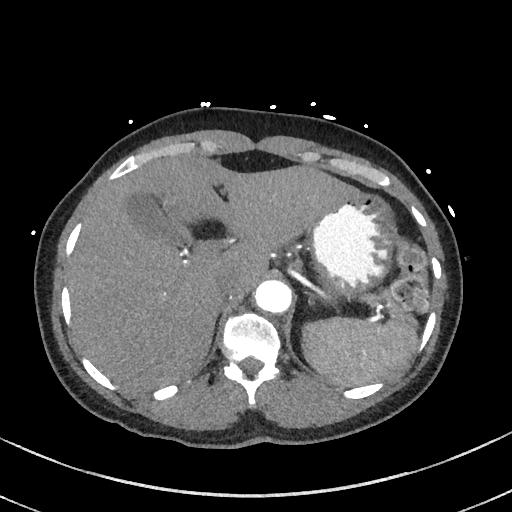
[im 242/285  bone]
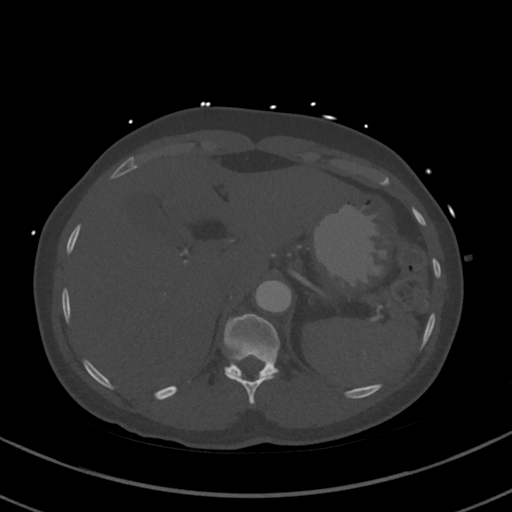
[im 270/285  soft-tissue]
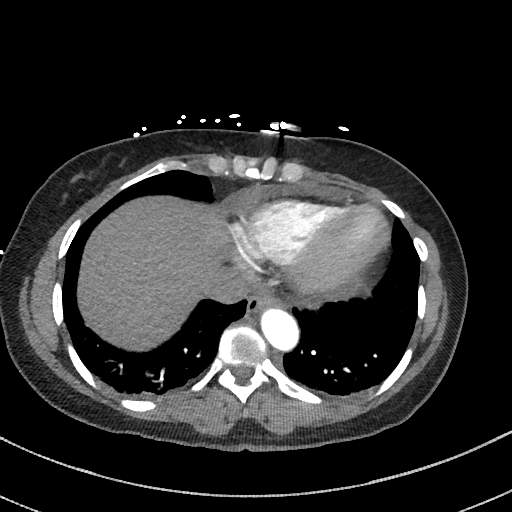

[Series 7: dissection 2mm cor · coronal · 0.67mm/px · 3 of 122 slices shown]
[im 31/122  soft-tissue]
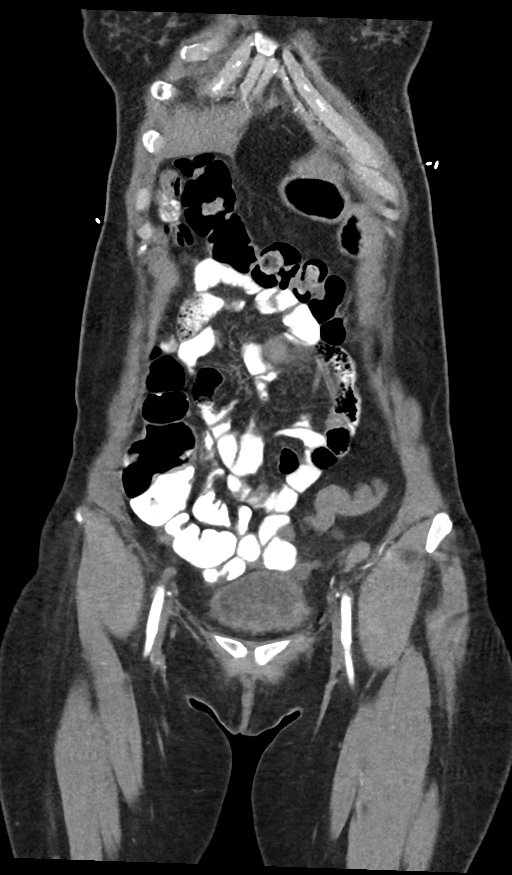
[im 61/122  soft-tissue]
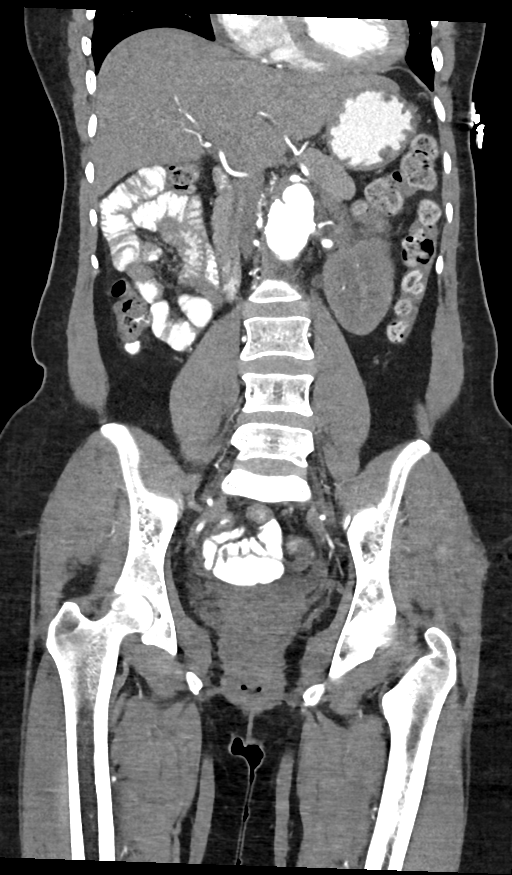
[im 91/122  soft-tissue]
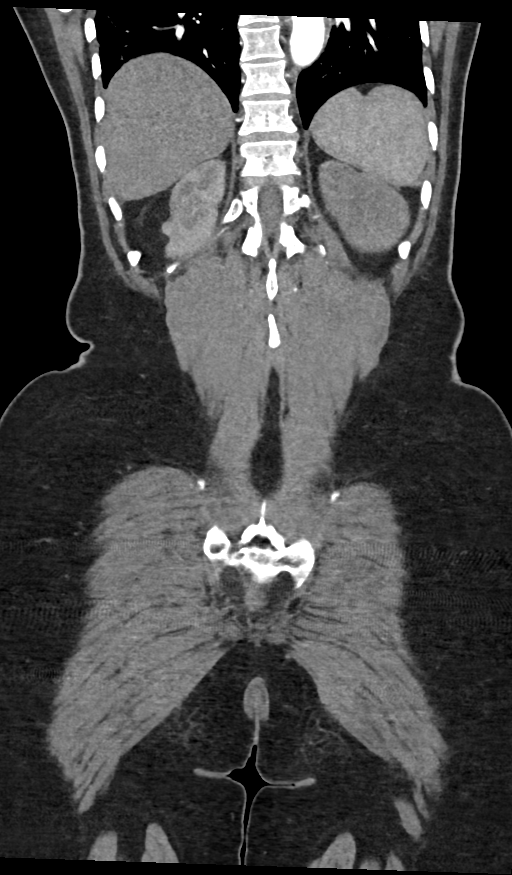

[13 of 46 positions shown; findings below may reference images not displayed]

FINDINGS: ARTERIAL

Aorta: Visualized distal descending thoracic segment is tortuous and
ectatic up to 3.2 cm diameter with some eccentric nonocclusive mural
thrombus. Fusiform dilatation of the abdominal aorta measuring
cm maximum transverse diameter at the level of the renal artery
origins, dilated to at least 5.4 cm diameter at the bifurcation. Two
regions of Linear nonocclusive intraluminal webs versus dissection
flaps are noted at approximately the level of the SMA origin, and
approximately 18 mm inferior to the renal artery origins. There is
nonocclusive mural thrombus in the distal aneurysmal segment. No
aortic stenosis.

Celiac axis:          Patent

Superior mesenteric: Patent, with replaced right hepatic arterial
supply, an anatomic variant.

Left renal: Single, with early branching, no high-grade stenosis.

Right renal: Single, with calcified ostial plaque resulting in short
segment high-grade origin stenosis. There is an enlarged adrenal
artery arising from just beyond the origin. Right renal artery is
widely patent distally.

Inferior mesenteric:  Long segment origin occlusion.

Left iliac: Saccular aneurysm from the common iliac artery
contiguous with the aortic aneurysm. This measures at least 4.7 cm
diameter and terminates above the common iliac bifurcation. The
lumen of the saccular component is largely thrombosed with subtle
more low-attenuation centrally, and contains an 8 mm focal calcific
fragment. No dissection or stenosis. Internal and external iliac
arteries are unremarkable.

Right iliac: Saccular 2.6 cm aneurysm of the proximal common iliac
contiguous with the distal aortic Disease, with patent lumen, and
mural thrombus. The common iliac regains near normal caliber 13 mm
in the midportion and is fairly unremarkable distally as are the
external and internal iliac arteries.

VENOUS

Dedicated venous phase imaging not obtained.

Review of the MIP images confirms the above findings.

NONVASCULAR

Lower chest: Subsegmental dependent atelectasis in the visualized
lung bases. Trace pleural effusions. Small pericardial effusion.

Hepatobiliary: No masses or other significant abnormality.

Pancreas: No mass, inflammatory changes, or other significant
abnormality.

Spleen: Within normal limits in size and appearance.

Adrenals/Urinary Tract: Adrenal glands unremarkable. Small right
kidney. Compensatory hypertrophy of the left kidney. There is left
moderate hydronephrosis and proximal ureterectasis extending down to
the level of the aortic bifurcation, and appears involved by the
left common iliac saccular aneurysm process. Urinary bladder is
incompletely distended.

Stomach/Bowel: No evidence of obstruction, inflammatory process, or
abnormal fluid collections.

Lymphatic: No pathologically enlarged lymph nodes.

Reproductive: Previous hysterectomy.

Other: Small amount of free pelvic fluid. No abdominal ascites. No
free air.

Musculoskeletal: No suspicious bone lesions identified.
IMPRESSION: 1. Unusual juxtarenal and infrarenal aortic aneurysm with bilateral
iliac artery involvement as detailed above. The dominant saccular
component involving the bifurcation and left common iliac artery may
represent contained rupture, rapidly expanding atheromatous or
mycotic aneurysm. Atypical appearance for reactive retroperitoneal
fibrosis, which could account for the left ureter involvement.
2. Moderate left hydronephrosis and proximal ureterectasis.
3. High-grade right renal ostial stenosis with renal atrophy.
4. Small pericardial effusion.

## 2017-06-27 DIAGNOSIS — S0300XA Dislocation of jaw, unspecified side, initial encounter: Secondary | ICD-10-CM | POA: Insufficient documentation

## 2017-07-28 DIAGNOSIS — K56609 Unspecified intestinal obstruction, unspecified as to partial versus complete obstruction: Secondary | ICD-10-CM

## 2017-07-28 HISTORY — DX: Unspecified intestinal obstruction, unspecified as to partial versus complete obstruction: K56.609

## 2017-08-01 ENCOUNTER — Inpatient Hospital Stay (HOSPITAL_COMMUNITY)
Admission: EM | Admit: 2017-08-01 | Discharge: 2017-08-04 | DRG: 390 | Disposition: A | Discharge status: Home / Self Care | Payer: BLUE CROSS/BLUE SHIELD | Attending: Internal Medicine | Admitting: Internal Medicine

## 2017-08-01 ENCOUNTER — Emergency Department (HOSPITAL_COMMUNITY): Payer: BLUE CROSS/BLUE SHIELD

## 2017-08-01 ENCOUNTER — Other Ambulatory Visit: Payer: Self-pay

## 2017-08-01 ENCOUNTER — Encounter (HOSPITAL_COMMUNITY): Payer: Self-pay | Admitting: Emergency Medicine

## 2017-08-01 ENCOUNTER — Inpatient Hospital Stay (HOSPITAL_COMMUNITY): Payer: BLUE CROSS/BLUE SHIELD

## 2017-08-01 DIAGNOSIS — Z90710 Acquired absence of both cervix and uterus: Secondary | ICD-10-CM | POA: Diagnosis not present

## 2017-08-01 DIAGNOSIS — K56609 Unspecified intestinal obstruction, unspecified as to partial versus complete obstruction: Secondary | ICD-10-CM | POA: Diagnosis present

## 2017-08-01 DIAGNOSIS — K219 Gastro-esophageal reflux disease without esophagitis: Secondary | ICD-10-CM | POA: Diagnosis present

## 2017-08-01 DIAGNOSIS — Z79899 Other long term (current) drug therapy: Secondary | ICD-10-CM | POA: Diagnosis not present

## 2017-08-01 DIAGNOSIS — R05 Cough: Secondary | ICD-10-CM

## 2017-08-01 DIAGNOSIS — I10 Essential (primary) hypertension: Secondary | ICD-10-CM | POA: Diagnosis present

## 2017-08-01 DIAGNOSIS — Z8679 Personal history of other diseases of the circulatory system: Secondary | ICD-10-CM | POA: Diagnosis not present

## 2017-08-01 DIAGNOSIS — Z7982 Long term (current) use of aspirin: Secondary | ICD-10-CM

## 2017-08-01 DIAGNOSIS — E876 Hypokalemia: Secondary | ICD-10-CM | POA: Diagnosis present

## 2017-08-01 DIAGNOSIS — R1013 Epigastric pain: Secondary | ICD-10-CM | POA: Diagnosis present

## 2017-08-01 DIAGNOSIS — Z0189 Encounter for other specified special examinations: Secondary | ICD-10-CM

## 2017-08-01 DIAGNOSIS — K56600 Partial intestinal obstruction, unspecified as to cause: Secondary | ICD-10-CM | POA: Diagnosis present

## 2017-08-01 DIAGNOSIS — R058 Other specified cough: Secondary | ICD-10-CM

## 2017-08-01 HISTORY — DX: Unspecified intestinal obstruction, unspecified as to partial versus complete obstruction: K56.609

## 2017-08-01 LAB — COMPREHENSIVE METABOLIC PANEL
ALT: 15 U/L (ref 14–54)
AST: 19 U/L (ref 15–41)
Albumin: 3.7 g/dL (ref 3.5–5.0)
Alkaline Phosphatase: 108 U/L (ref 38–126)
Anion gap: 13 (ref 5–15)
BUN: 10 mg/dL (ref 6–20)
CHLORIDE: 95 mmol/L — AB (ref 101–111)
CO2: 29 mmol/L (ref 22–32)
CREATININE: 0.91 mg/dL (ref 0.44–1.00)
Calcium: 10 mg/dL (ref 8.9–10.3)
GFR calc Af Amer: >60 mL/min (ref >60)
GFR calc non Af Amer: >60 mL/min (ref >60)
Glucose, Bld: 96 mg/dL (ref 65–99)
Potassium: 3.1 mmol/L — ABNORMAL LOW (ref 3.5–5.1)
SODIUM: 137 mmol/L (ref 135–145)
Total Bilirubin: 0.5 mg/dL (ref 0.3–1.2)
Total Protein: 8.6 g/dL — ABNORMAL HIGH (ref 6.5–8.1)

## 2017-08-01 LAB — URINALYSIS, ROUTINE W REFLEX MICROSCOPIC
Bilirubin Urine: NEGATIVE
GLUCOSE, UA: NEGATIVE mg/dL
HGB URINE DIPSTICK: NEGATIVE
KETONES UR: 5 mg/dL — AB
Leukocytes, UA: NEGATIVE
Nitrite: NEGATIVE
PROTEIN: NEGATIVE mg/dL
Specific Gravity, Urine: 1.019 (ref 1.005–1.030)
pH: 7 (ref 5.0–8.0)

## 2017-08-01 LAB — CBC
HCT: 40.8 % (ref 36.0–46.0)
Hemoglobin: 13.8 g/dL (ref 12.0–15.0)
MCH: 25.7 pg — AB (ref 26.0–34.0)
MCHC: 33.8 g/dL (ref 30.0–36.0)
MCV: 76.1 fL — AB (ref 78.0–100.0)
PLATELETS: 213 10*3/uL (ref 150–400)
RBC: 5.36 MIL/uL — ABNORMAL HIGH (ref 3.87–5.11)
RDW: 15.7 % — AB (ref 11.5–15.5)
WBC: 6.4 10*3/uL (ref 4.0–10.5)

## 2017-08-01 LAB — I-STAT BETA HCG BLOOD, ED (MC, WL, AP ONLY): I-stat hCG, quantitative: 6.2 m[IU]/mL — ABNORMAL HIGH (ref <5)

## 2017-08-01 LAB — LIPASE, BLOOD: LIPASE: 27 U/L (ref 11–51)

## 2017-08-01 MED ORDER — IOPAMIDOL (ISOVUE-300) INJECTION 61%
INTRAVENOUS | Status: AC
  Administered 2017-08-01: 100 mL
  Filled 2017-08-01: qty 100

## 2017-08-01 MED ORDER — DEXTROSE-NACL 5-0.45 % IV SOLN: INTRAVENOUS | Status: DC

## 2017-08-01 MED ORDER — SODIUM CHLORIDE 0.9 % IV SOLN: 250.0000 mL | INTRAVENOUS | Status: DC | PRN

## 2017-08-01 MED ORDER — ALBUTEROL SULFATE (2.5 MG/3ML) 0.083% IN NEBU: 2.5000 mg | INHALATION_SOLUTION | RESPIRATORY_TRACT | Status: DC | PRN

## 2017-08-01 MED ORDER — TRAZODONE HCL 50 MG PO TABS: 50.0000 mg | ORAL_TABLET | Freq: Every evening | ORAL | Status: DC | PRN

## 2017-08-01 MED ORDER — KCL IN DEXTROSE-NACL 40-5-0.45 MEQ/L-%-% IV SOLN
INTRAVENOUS | Status: AC
  Administered 2017-08-01 – 2017-08-04 (×5): via INTRAVENOUS
  Filled 2017-08-01 (×7): qty 1000

## 2017-08-01 MED ORDER — MORPHINE SULFATE (PF) 4 MG/ML IV SOLN
2.0000 mg | INTRAVENOUS | Status: DC | PRN
  Administered 2017-08-01 – 2017-08-02 (×5): 2 mg via INTRAVENOUS
  Filled 2017-08-01 (×6): qty 1

## 2017-08-01 MED ORDER — MORPHINE SULFATE (PF) 4 MG/ML IV SOLN
4.0000 mg | Freq: Once | INTRAVENOUS | Status: AC
  Administered 2017-08-01: 4 mg via INTRAVENOUS
  Filled 2017-08-01: qty 1

## 2017-08-01 MED ORDER — HYDRALAZINE HCL 20 MG/ML IJ SOLN
10.0000 mg | Freq: Four times a day (QID) | INTRAMUSCULAR | Status: DC | PRN
  Administered 2017-08-02: 10 mg via INTRAVENOUS
  Filled 2017-08-01: qty 1

## 2017-08-01 MED ORDER — PROCHLORPERAZINE 25 MG RE SUPP
25.0000 mg | Freq: Two times a day (BID) | RECTAL | Status: DC | PRN
  Administered 2017-08-02: 25 mg via RECTAL
  Filled 2017-08-01 (×2): qty 1

## 2017-08-01 MED ORDER — ACETAMINOPHEN 325 MG PO TABS
650.0000 mg | ORAL_TABLET | Freq: Four times a day (QID) | ORAL | Status: DC | PRN
  Administered 2017-08-02 – 2017-08-03 (×2): 650 mg via ORAL
  Filled 2017-08-01 (×2): qty 2

## 2017-08-01 MED ORDER — SODIUM CHLORIDE 0.9% FLUSH
3.0000 mL | Freq: Two times a day (BID) | INTRAVENOUS | Status: DC
  Administered 2017-08-01: 3 mL via INTRAVENOUS

## 2017-08-01 MED ORDER — ONDANSETRON 4 MG PO TBDP: 4.0000 mg | ORAL_TABLET | Freq: Once | ORAL | Status: DC | PRN

## 2017-08-01 MED ORDER — CARVEDILOL 12.5 MG PO TABS: 12.5000 mg | ORAL_TABLET | Freq: Two times a day (BID) | ORAL | Status: DC

## 2017-08-01 MED ORDER — ONDANSETRON HCL 4 MG/2ML IJ SOLN
4.0000 mg | Freq: Once | INTRAMUSCULAR | Status: AC
  Administered 2017-08-01: 4 mg via INTRAVENOUS
  Filled 2017-08-01: qty 2

## 2017-08-01 MED ORDER — BISACODYL 10 MG RE SUPP: 10.0000 mg | Freq: Once | RECTAL | Status: DC

## 2017-08-01 MED ORDER — HEPARIN SODIUM (PORCINE) 5000 UNIT/ML IJ SOLN
5000.0000 [IU] | Freq: Three times a day (TID) | INTRAMUSCULAR | Status: DC
  Administered 2017-08-01 – 2017-08-04 (×8): 5000 [IU] via SUBCUTANEOUS
  Filled 2017-08-01 (×8): qty 1

## 2017-08-01 MED ORDER — PANTOPRAZOLE SODIUM 40 MG IV SOLR
40.0000 mg | Freq: Once | INTRAVENOUS | Status: AC
  Administered 2017-08-01: 40 mg via INTRAVENOUS
  Filled 2017-08-01: qty 40

## 2017-08-01 MED ORDER — CARVEDILOL 25 MG PO TABS
25.0000 mg | ORAL_TABLET | Freq: Two times a day (BID) | ORAL | Status: DC
  Administered 2017-08-02 – 2017-08-04 (×5): 25 mg via ORAL
  Filled 2017-08-01 (×6): qty 1

## 2017-08-01 MED ORDER — SODIUM CHLORIDE 0.9% FLUSH: 3.0000 mL | INTRAVENOUS | Status: DC | PRN

## 2017-08-01 MED ORDER — SENNA 8.6 MG PO TABS
1.0000 | ORAL_TABLET | Freq: Two times a day (BID) | ORAL | Status: DC
  Administered 2017-08-01 – 2017-08-04 (×6): 8.6 mg via ORAL
  Filled 2017-08-01 (×6): qty 1

## 2017-08-01 MED ORDER — FAMOTIDINE IN NACL 20-0.9 MG/50ML-% IV SOLN
20.0000 mg | Freq: Two times a day (BID) | INTRAVENOUS | Status: DC
  Administered 2017-08-01 – 2017-08-04 (×6): 20 mg via INTRAVENOUS
  Filled 2017-08-01 (×7): qty 50

## 2017-08-01 MED ORDER — ONDANSETRON HCL 4 MG PO TABS: 4.0000 mg | ORAL_TABLET | Freq: Four times a day (QID) | ORAL | Status: DC | PRN

## 2017-08-01 MED ORDER — ACETAMINOPHEN 650 MG RE SUPP: 650.0000 mg | Freq: Four times a day (QID) | RECTAL | Status: DC | PRN

## 2017-08-01 MED ORDER — DIATRIZOATE MEGLUMINE & SODIUM 66-10 % PO SOLN
90.0000 mL | Freq: Once | ORAL | Status: AC
  Administered 2017-08-01: 90 mL via NASOGASTRIC
  Filled 2017-08-01: qty 90

## 2017-08-01 MED ORDER — ONDANSETRON HCL 4 MG/2ML IJ SOLN
4.0000 mg | INTRAMUSCULAR | Status: DC | PRN
  Administered 2017-08-01 – 2017-08-02 (×5): 4 mg via INTRAVENOUS
  Filled 2017-08-01 (×6): qty 2

## 2017-08-01 MED ORDER — KETOROLAC TROMETHAMINE 15 MG/ML IJ SOLN: 15.0000 mg | Freq: Four times a day (QID) | INTRAMUSCULAR | Status: DC | PRN

## 2017-08-01 MED ORDER — POLYETHYLENE GLYCOL 3350 17 G PO PACK: 17.0000 g | PACK | Freq: Every day | ORAL | Status: DC | PRN

## 2017-08-01 MED ORDER — SODIUM CHLORIDE 0.9 % IV BOLUS (SEPSIS)
500.0000 mL | Freq: Once | INTRAVENOUS | Status: AC
Start: 2017-08-01 — End: 2017-08-01
  Administered 2017-08-01: 500 mL via INTRAVENOUS

## 2017-08-01 MED ORDER — ASPIRIN EC 81 MG PO TBEC
81.0000 mg | DELAYED_RELEASE_TABLET | Freq: Every day | ORAL | Status: DC
  Administered 2017-08-02 – 2017-08-04 (×3): 81 mg via ORAL
  Filled 2017-08-01 (×3): qty 1

## 2017-08-01 NOTE — ED Provider Notes (Signed)
[facility]  SURGICAL Provider Note   CSN: [id number] Arrival date & time: 08/01/17  2426     History   Chief Complaint Chief Complaint  Patient presents with  . Abdominal Pain    HPI Connie Davis is a 51 y.o. female with history of hypertension, GERD, repaired AAA who presents with a 2-day history of epigastric pain.  She had onset of nausea and vomiting this morning after she took a hydrocodone for the pain.  She has not been able to take any of her medications due to her pain.  She denies any diarrhea.  Her last bowel movement was yesterday and was normal.  She denies any urinary symptoms, fevers, chest pain, shortness of breath.  HPI  Past Medical History:  Diagnosis Date  . Abdominal aneurysm ([place])   . Blood transfusion without reported diagnosis   . GERD (gastroesophageal reflux disease)   . Hypertension     Patient Active Problem List   Diagnosis Date Noted  . SBO (small bowel obstruction) ([place]) 08/01/2017  . HTN (hypertension) 08/01/2017  . Hypokalemia 08/01/2017  . Postoperative state 01/01/2014  . S/P hysterectomy- [facility] 01/01/2014    Past Surgical History:  Procedure Laterality Date  . ABDOMINAL AORTIC ANEURYSM REPAIR  2017  . BILATERAL SALPINGECTOMY Bilateral 01/01/2014   Procedure: BILATERAL SALPINGECTOMY;  Surgeon: Princess Bruins, MD;  Location: [facility];  Service: Gynecology;  Laterality: Bilateral;  . NO PAST SURGERIES    . ROBOTIC ASSISTED TOTAL HYSTERECTOMY N/A 01/01/2014   Procedure: ROBOTIC ASSISTED TOTAL HYSTERECTOMY;  Surgeon: Princess Bruins, MD;  Location: [facility];  Service: Gynecology;  Laterality: N/A;    OB History    No data available       Home Medications    Prior to Admission medications   Medication Sig Start Date End Date Taking? Authorizing Provider  amLODipine (NORVASC) 5 MG tablet Take 5 mg by mouth daily.   Yes [provider]  aspirin EC 81 MG tablet Take 81 mg by mouth every morning.     Yes [provider]  carvedilol (COREG) 25 MG tablet Take 25 mg by mouth 2 (two) times daily. 03/01/16  Yes [provider]  hydrochlorothiazide (HYDRODIURIL) 25 MG tablet Take 25 mg by mouth daily.   Yes [provider]  pantoprazole (PROTONIX) 40 MG tablet Take 40 mg by mouth daily.   Yes [provider]  Propylene Glycol (SYSTANE BALANCE OP) Place 1 drop into both eyes daily as needed. For dry eyes   Yes [provider]  acetaminophen (TYLENOL) 500 MG tablet Take 1 tablet (500 mg total) by mouth every 6 (six) hours as needed for mild pain or moderate pain. Patient not taking: Reported on 05/02/2017 02/11/16   Antonietta Breach, PA-C  amLODipine (NORVASC) 10 MG tablet Take 10 mg by mouth daily. 03/01/16 03/01/17  [provider]  metoCLOPramide (REGLAN) 10 MG tablet Take 1 tablet (10 mg total) by mouth every 6 (six) hours. Patient not taking: Reported on 05/02/2017 04/02/16   Anne Ng, PA-C  pantoprazole (PROTONIX) 20 MG tablet Take 1 tablet (20 mg total) by mouth daily. Patient not taking: Reported on 08/01/2017 02/11/16   Antonietta Breach, PA-C    Family History Family History  Problem Relation Age of Onset  . Colon cancer Neg Hx     Social History Social History   Tobacco Use  . Smoking status: Never Smoker  . Smokeless tobacco: Never Used  Substance Use  Topics  . Alcohol use: No  . Drug use: No     Allergies   Patient has no known allergies.   Review of Systems Review of Systems  Constitutional: Negative for chills and fever.  HENT: Negative for facial swelling and sore throat.   Respiratory: Negative for shortness of breath.   Cardiovascular: Negative for chest pain.  Gastrointestinal: Positive for abdominal pain, nausea and vomiting. Negative for constipation and diarrhea.  Genitourinary: Negative for dysuria.  Musculoskeletal: Negative for back pain.  Skin: Negative for rash and wound.  Neurological: Negative for headaches.    Psychiatric/Behavioral: The patient is not nervous/anxious.      Physical Exam Updated Vital Signs BP (!) 141/85   Pulse 79   Temp 98 F (36.7 C) (Oral)   Resp 16   LMP 12/22/2013   SpO2 97%   Physical Exam  Constitutional: She appears well-developed and well-nourished. No distress.  HENT:  Head: Normocephalic and atraumatic.  Mouth/Throat: Oropharynx is clear and moist. No oropharyngeal exudate.  Eyes: Conjunctivae are normal. Pupils are equal, round, and reactive to light. Right eye exhibits no discharge. Left eye exhibits no discharge. No scleral icterus.  Neck: Normal range of motion. Neck supple. No thyromegaly present.  Cardiovascular: Normal rate, regular rhythm, normal heart sounds and intact distal pulses. Exam reveals no gallop and no friction rub.  No murmur heard. Pulmonary/Chest: Effort normal and breath sounds normal. No stridor. No respiratory distress. She has no wheezes. She has no rales.  Abdominal: Soft. Bowel sounds are normal. She exhibits no distension. There is tenderness in the epigastric area. There is no rebound and no guarding.  Musculoskeletal: She exhibits no edema.  Lymphadenopathy:    She has no cervical adenopathy.  Neurological: She is alert. Coordination normal.  Skin: Skin is warm and dry. No rash noted. She is not diaphoretic. No pallor.  Psychiatric: She has a normal mood and affect.  Nursing note and vitals reviewed.    ED Treatments / Results  Labs (all labs ordered are listed, but only abnormal results are displayed) Labs Reviewed  COMPREHENSIVE METABOLIC PANEL - Abnormal; Notable for the following components:      Result Value   Potassium 3.1 (*)    Chloride 95 (*)    Total Protein 8.6 (*)    All other components within normal limits  CBC - Abnormal; Notable for the following components:   RBC 5.36 (*)    MCV 76.1 (*)    MCH 25.7 (*)    RDW 15.7 (*)    All other components within normal limits  URINALYSIS, ROUTINE W REFLEX  MICROSCOPIC - Abnormal; Notable for the following components:   Ketones, ur 5 (*)    All other components within normal limits  I-STAT BETA HCG BLOOD, ED ([facility], [facility], [facility] ONLY) - Abnormal; Notable for the following components:   I-stat hCG, quantitative 6.2 (*)    All other components within normal limits  LIPASE, BLOOD  HIV ANTIBODY (ROUTINE TESTING)    EKG  EKG Interpretation None       Radiology Ct Abdomen Pelvis W Contrast  Result Date: 08/01/2017 CLINICAL DATA:  Abdominal pain with nausea and vomiting EXAM: CT ABDOMEN AND PELVIS WITH CONTRAST TECHNIQUE: Multidetector CT imaging of the abdomen and pelvis was performed using the standard protocol following bolus administration of intravenous contrast. CONTRAST:  175mL ISOVUE-300 IOPAMIDOL (ISOVUE-300) INJECTION 61% COMPARISON:  April 02, 2016 and February 17, 2016 FINDINGS: Lower chest: Lung bases are clear. Hepatobiliary:  No focal liver lesions are evident. Gallbladder wall does thickened. There is no biliary duct dilatation. Pancreas: No pancreatic mass or inflammatory focus. Spleen: No splenic lesions are evident. Adrenals/Urinary Tract: Adrenals appear unremarkable bilaterally. There is evidence of chronic decreased attenuation in the lateral aspect of the right kidney with scarring and localized right lateral calyseal dilatation, similar in appearance to prior studies. Question prior infarct in this area versus dysplasia. There is no well-defined renal mass on either side. There is mild chronic fullness of the left renal collecting system. There is no similar fullness of the right renal collecting system. There is no renal or ureteral calculus on either side. Urinary bladder is midline with wall thickness within normal limits. Stomach/Bowel: There is dilatation of multiple loops of small bowel with a transition zone noted in the proximal ileum region consistent with a degree of small bowel obstruction. Several loops of jejunum show mild wall  thickening. There is no free air or portal venous air. There are scattered colonic diverticula without diverticulitis. Vascular/Lymphatic: The patient has had a previous aorto bi-iliac bypass graft. In comparison with the previous study, there is considerably less fluid surrounding the iliac arteries. There remains dilatation of the aorta at the level of the renal arteries with a maximum transverse diameter of 3.8 x 3.7 cm. There remains dilatation of the aorta slightly more distally measuring 3.5 x 3.5 cm. There is periaortic fluid as well as areas of calcification. Mild fluid surrounds the proximal common iliac arteries, much less than on prior study. There is no contrast extravasation. The major mesenteric arteries appear patent, although there is narrowing of the proximal right renal artery. No adenopathy is evident in the abdomen or pelvis. Reproductive: Uterus is absent. There is no pelvic mass. There is moderate free fluid in the dependent portion of the pelvis. Other: There is no periappendiceal region inflammation. No abscess is evident in the abdomen or pelvis. There is a small ventral hernia containing only fat. No ascites is seen outside of the pelvis. Musculoskeletal: There are no blastic or lytic bone lesions. There is no intramuscular or abdominal wall lesions evident. IMPRESSION: 1. Evidence of a degree of small bowel obstruction with transition zone in the proximal ileum region. No free air. 2 status post aorta bi-iliac repair. There is much less fluid surrounding the proximal iliac arteries compared to the previous study. Apparent retroperitoneal hematoma seen previously has resolved. There remains aneurysmal dilatation in the mid the distal aorta with a mean maximum transverse diameter at the level of the renal arteries measuring 3.8 x 3.7 cm. Recommend followup by ultrasound in 2 years. This recommendation follows ACR consensus guidelines: White Paper of the ACR Incidental Findings Committee II  on Vascular Findings. J Am Coll Radiol 2013; 10:789-794. There is no periaortic fluid. There is atherosclerotic change in the aorta. 3.  Moderate ascites in the pelvis.  No ascites elsewhere. 4. Question prior infarct with scarring in the lateral right kidney. There may be a degree of dysplasia in this area. This appearance is essentially stable compared to previous studies. 5. No renal or ureteral calculus. Mild fullness of the left renal collecting system is chronic and potentially may be due to previous scarring involving the left ureter due to the previous aneurysm. This finding has not progressed from prior study. 6.  Uterus absent. 7.  Small ventral hernia containing only fat. Aortic aneurysm NOS (ICD10-I71.9). Aortic Atherosclerosis (ICD10-I70.0). Electronically Signed   By: Lowella Grip III M.D.   On:  08/01/2017 13:44    Procedures Procedures (including critical care time)  Medications Ordered in ED Medications  aspirin EC tablet 81 mg (not administered)  carvedilol (COREG) tablet 25 mg (not administered)  sodium chloride flush (NS) 0.9 % injection 3 mL (not administered)  sodium chloride flush (NS) 0.9 % injection 3 mL (not administered)  0.9 %  sodium chloride infusion (not administered)  acetaminophen (TYLENOL) tablet 650 mg (not administered)    Or  acetaminophen (TYLENOL) suppository 650 mg (not administered)  traZODone (DESYREL) tablet 50 mg (not administered)  senna (SENOKOT) tablet 8.6 mg (not administered)  polyethylene glycol (MIRALAX / GLYCOLAX) packet 17 g (not administered)  ondansetron (ZOFRAN) tablet 4 mg (not administered)    Or  ondansetron (ZOFRAN) injection 4 mg (not administered)  albuterol (PROVENTIL) (2.5 MG/3ML) 0.083% nebulizer solution 2.5 mg (not administered)  heparin injection 5,000 Units (not administered)  ketorolac (TORADOL) 15 MG/ML injection 15 mg (not administered)  bisacodyl (DULCOLAX) suppository 10 mg (not administered)  prochlorperazine  (COMPAZINE) suppository 25 mg (not administered)  famotidine (PEPCID) IVPB 20 mg premix (not administered)  morphine 4 MG/ML injection 2 mg (not administered)  hydrALAZINE (APRESOLINE) injection 10 mg (not administered)  diatrizoate meglumine-sodium (GASTROGRAFIN) 66-10 % solution 90 mL (not administered)  dextrose 5 % and 0.45 % NaCl with KCl 40 mEq/L infusion (not administered)  sodium chloride 0.9 % bolus 500 mL (0 mLs Intravenous Stopped 08/01/17 1533)  ondansetron (ZOFRAN) injection 4 mg (4 mg Intravenous Given 08/01/17 1228)  pantoprazole (PROTONIX) injection 40 mg (40 mg Intravenous Given 08/01/17 1231)  morphine 4 MG/ML injection 4 mg (4 mg Intravenous Given 08/01/17 1228)  iopamidol (ISOVUE-300) 61 % injection (100 mLs  Contrast Given 08/01/17 1310)     Initial Impression / Assessment and Plan / ED Course  I have reviewed the triage vital signs and the nursing notes.  Pertinent labs & imaging results that were available during my care of the patient were reviewed by me and considered in my medical decision making (see chart for details).     Patient with evidence of small bowel obstruction with transition point in the proximal ileum region without free air.  CT findings otherwise stable from past.  Patient's pain controlled with morphine, Protonix, Zofran.  500 mL bolus given in the ED.  I consulted general surgery and spoke with Jackson Latino, PA-C who evaluated the patient and will start small bowel obstruction protocol.  I consulted [facility] and spoke with Dr. Denton Brick who will admit the patient for further evaluation and treatment.  Patient understands and agrees with plan.  Patient vitals stable throughout ED course.  Final Clinical Impressions(s) / ED Diagnoses   Final diagnoses:  Encounter for imaging study to confirm nasogastric (NG) tube placement  Small bowel obstruction [facility])    ED Discharge Orders    None       Frederica Kuster, PA-C 08/01/17 1617      Margette Fast, MD 08/02/17 1053

## 2017-08-01 NOTE — Consult Note (Signed)
[facility] Consult/Admission Note  Connie Davis Oct 31, 1965  [id number].    Requesting MD: Armstead Peaks, PA-C  Chief Complaint/Reason for Consult: SBO  HPI:   Patient is a 51 year old female with a history of HTN, and GERD, AAA S/P repair in 2017, robotic-assisted total hysterectomy who presented to the ED with complaints of epigastric abdominal pain since yesterday morning. Patient states the pain woke her up. Pain comes and goes. It waxes and wanes in severity. Nonradiating, pain medicine she's received in the ED has helped. Patient took a Norco at home and vomited. She's had no other episodes of emesis. She has normal bowel movement yesterday around 5 PM. She states she's not had flatus since. Patient denies melena, diarrhea, chest pain, shortness breath. CT scan revealed SBO with transition zone in the proximal ileum region, small ventral hernia containing only fat. Potassium 3.1. Otherwise labs unremarkable.  ROS:  Review of Systems  Constitutional: Negative for chills and fever.  HENT: Negative for sore throat.   Respiratory: Negative for shortness of breath.   Cardiovascular: Negative for chest pain.  Gastrointestinal: Positive for abdominal pain, nausea and vomiting. Negative for blood in stool, constipation, diarrhea and melena.  Skin: Negative for rash.  Neurological: Negative for dizziness and loss of consciousness.  All other systems reviewed and are negative.    Family History  Problem Relation Age of Onset  . Colon cancer Neg Hx     Past Medical History:  Diagnosis Date  . Abdominal aneurysm ([place])   . Blood transfusion without reported diagnosis   . GERD (gastroesophageal reflux disease)   . Hypertension     Past Surgical History:  Procedure Laterality Date  . ABDOMINAL AORTIC ANEURYSM REPAIR  2017  . BILATERAL SALPINGECTOMY Bilateral 01/01/2014   Procedure: BILATERAL SALPINGECTOMY;  Surgeon: Princess Bruins, MD;  Location: [facility];  Service:  Gynecology;  Laterality: Bilateral;  . NO PAST SURGERIES    . ROBOTIC ASSISTED TOTAL HYSTERECTOMY N/A 01/01/2014   Procedure: ROBOTIC ASSISTED TOTAL HYSTERECTOMY;  Surgeon: Princess Bruins, MD;  Location: [facility];  Service: Gynecology;  Laterality: N/A;    Social History:  reports that  has never smoked. she has never used smokeless tobacco. She reports that she does not drink alcohol or use drugs.  Allergies: No Known Allergies   (Not in a hospital admission)  Blood pressure 133/79, pulse 68, temperature 98 F (36.7 C), temperature source Oral, resp. rate 12, last menstrual period 12/22/2013, SpO2 94 %.  Physical Exam  Constitutional: She is oriented to person, place, and time and well-developed, well-nourished, and in no distress. Vital signs are normal. No distress.  HENT:  Head: Normocephalic and atraumatic.  Nose: Nose normal.  Mouth/Throat: Oropharynx is clear and moist. No oropharyngeal exudate.  Eyes: Conjunctivae are normal. Pupils are equal, round, and reactive to light. Right eye exhibits no discharge. Left eye exhibits no discharge. No scleral icterus.  Neck: Normal range of motion. Neck supple. No thyromegaly present.  Cardiovascular: Normal rate, regular rhythm, normal heart sounds and intact distal pulses. Exam reveals no gallop and no friction rub.  No murmur heard. Pulses:      Radial pulses are 2+ on the right side, and 2+ on the left side.  Pulmonary/Chest: Effort normal and breath sounds normal. No respiratory distress. She has no decreased breath sounds. She has no wheezes. She has no rhonchi. She has no rales.  Abdominal: Soft. She exhibits no distension and no mass. Bowel sounds are absent. There is no  hepatosplenomegaly. There is tenderness (mild) in the right upper quadrant, epigastric area and left upper quadrant. There is no rebound and no guarding.  Musculoskeletal: Normal range of motion. She exhibits no edema or deformity.  Lymphadenopathy:    She has no  cervical adenopathy.  Neurological: She is alert and oriented to person, place, and time.  Skin: Skin is warm and dry. No rash noted. She is not diaphoretic.  Psychiatric: Mood and affect normal.  Nursing note and vitals reviewed.   Results for orders placed or performed during the hospital encounter of 08/01/17 (from the past 48 hour(s))  Lipase, blood     Status: None   Collection Time: 08/01/17  8:26 AM  Result Value Ref Range   Lipase 27 11 - 51 U/L  Comprehensive metabolic panel     Status: Abnormal   Collection Time: 08/01/17  8:26 AM  Result Value Ref Range   Sodium 137 135 - 145 mmol/L   Potassium 3.1 (L) 3.5 - 5.1 mmol/L   Chloride 95 (L) 101 - 111 mmol/L   CO2 29 22 - 32 mmol/L   Glucose, Bld 96 65 - 99 mg/dL   BUN 10 6 - 20 mg/dL   Creatinine, Ser 0.91 0.44 - 1.00 mg/dL   Calcium 10.0 8.9 - 10.3 mg/dL   Total Protein 8.6 (H) 6.5 - 8.1 g/dL   Albumin 3.7 3.5 - 5.0 g/dL   AST 19 15 - 41 U/L   ALT 15 14 - 54 U/L   Alkaline Phosphatase 108 38 - 126 U/L   Total Bilirubin 0.5 0.3 - 1.2 mg/dL   GFR calc non Af Amer >60 >60 mL/min   GFR calc Af Amer >60 >60 mL/min    Comment: (NOTE) The eGFR has been calculated using the CKD EPI equation. This calculation has not been validated in all clinical situations. eGFR's persistently <60 mL/min signify possible Chronic Kidney Disease.    Anion gap 13 5 - 15  CBC     Status: Abnormal   Collection Time: 08/01/17  8:26 AM  Result Value Ref Range   WBC 6.4 4.0 - 10.5 K/uL   RBC 5.36 (H) 3.87 - 5.11 MIL/uL   Hemoglobin 13.8 12.0 - 15.0 g/dL   HCT 40.8 36.0 - 46.0 %   MCV 76.1 (L) 78.0 - 100.0 fL   MCH 25.7 (L) 26.0 - 34.0 pg   MCHC 33.8 30.0 - 36.0 g/dL   RDW 15.7 (H) 11.5 - 15.5 %   Platelets 213 150 - 400 K/uL  I-Stat beta hCG blood, ED     Status: Abnormal   Collection Time: 08/01/17  8:34 AM  Result Value Ref Range   I-stat hCG, quantitative 6.2 (H) <5 mIU/mL   Comment 3            Comment:   GEST. AGE      CONC.   (mIU/mL)   <=1 WEEK        5 - 50     2 WEEKS       50 - 500     3 WEEKS       100 - 10,000     4 WEEKS     1,000 - 30,000        FEMALE AND NON-PREGNANT FEMALE:     LESS THAN 5 mIU/mL   Urinalysis, Routine w reflex microscopic     Status: Abnormal   Collection Time: 08/01/17 12:00 PM  Result Value Ref Range  Color, Urine YELLOW YELLOW   APPearance CLEAR CLEAR   Specific Gravity, Urine 1.019 1.005 - 1.030   pH 7.0 5.0 - 8.0   Glucose, UA NEGATIVE NEGATIVE mg/dL   Hgb urine dipstick NEGATIVE NEGATIVE   Bilirubin Urine NEGATIVE NEGATIVE   Ketones, ur 5 (A) NEGATIVE mg/dL   Protein, ur NEGATIVE NEGATIVE mg/dL   Nitrite NEGATIVE NEGATIVE   Leukocytes, UA NEGATIVE NEGATIVE   Ct Abdomen Pelvis W Contrast  Result Date: 08/01/2017 CLINICAL DATA:  Abdominal pain with nausea and vomiting EXAM: CT ABDOMEN AND PELVIS WITH CONTRAST TECHNIQUE: Multidetector CT imaging of the abdomen and pelvis was performed using the standard protocol following bolus administration of intravenous contrast. CONTRAST:  165m ISOVUE-300 IOPAMIDOL (ISOVUE-300) INJECTION 61% COMPARISON:  April 02, 2016 and February 17, 2016 FINDINGS: Lower chest: Lung bases are clear. Hepatobiliary: No focal liver lesions are evident. Gallbladder wall does thickened. There is no biliary duct dilatation. Pancreas: No pancreatic mass or inflammatory focus. Spleen: No splenic lesions are evident. Adrenals/Urinary Tract: Adrenals appear unremarkable bilaterally. There is evidence of chronic decreased attenuation in the lateral aspect of the right kidney with scarring and localized right lateral calyseal dilatation, similar in appearance to prior studies. Question prior infarct in this area versus dysplasia. There is no well-defined renal mass on either side. There is mild chronic fullness of the left renal collecting system. There is no similar fullness of the right renal collecting system. There is no renal or ureteral calculus on either side.  Urinary bladder is midline with wall thickness within normal limits. Stomach/Bowel: There is dilatation of multiple loops of small bowel with a transition zone noted in the proximal ileum region consistent with a degree of small bowel obstruction. Several loops of jejunum show mild wall thickening. There is no free air or portal venous air. There are scattered colonic diverticula without diverticulitis. Vascular/Lymphatic: The patient has had a previous aorto bi-iliac bypass graft. In comparison with the previous study, there is considerably less fluid surrounding the iliac arteries. There remains dilatation of the aorta at the level of the renal arteries with a maximum transverse diameter of 3.8 x 3.7 cm. There remains dilatation of the aorta slightly more distally measuring 3.5 x 3.5 cm. There is periaortic fluid as well as areas of calcification. Mild fluid surrounds the proximal common iliac arteries, much less than on prior study. There is no contrast extravasation. The major mesenteric arteries appear patent, although there is narrowing of the proximal right renal artery. No adenopathy is evident in the abdomen or pelvis. Reproductive: Uterus is absent. There is no pelvic mass. There is moderate free fluid in the dependent portion of the pelvis. Other: There is no periappendiceal region inflammation. No abscess is evident in the abdomen or pelvis. There is a small ventral hernia containing only fat. No ascites is seen outside of the pelvis. Musculoskeletal: There are no blastic or lytic bone lesions. There is no intramuscular or abdominal wall lesions evident. IMPRESSION: 1. Evidence of a degree of small bowel obstruction with transition zone in the proximal ileum region. No free air. 2 status post aorta bi-iliac repair. There is much less fluid surrounding the proximal iliac arteries compared to the previous study. Apparent retroperitoneal hematoma seen previously has resolved. There remains aneurysmal  dilatation in the mid the distal aorta with a mean maximum transverse diameter at the level of the renal arteries measuring 3.8 x 3.7 cm. Recommend followup by ultrasound in 2 years. This recommendation follows ACR consensus guidelines:  White Paper of the ACR Incidental Findings Committee II on Vascular Findings. J Am Coll Radiol 2013; 10:789-794. There is no periaortic fluid. There is atherosclerotic change in the aorta. 3.  Moderate ascites in the pelvis.  No ascites elsewhere. 4. Question prior infarct with scarring in the lateral right kidney. There may be a degree of dysplasia in this area. This appearance is essentially stable compared to previous studies. 5. No renal or ureteral calculus. Mild fullness of the left renal collecting system is chronic and potentially may be due to previous scarring involving the left ureter due to the previous aneurysm. This finding has not progressed from prior study. 6.  Uterus absent. 7.  Small ventral hernia containing only fat. Aortic aneurysm NOS (ICD10-I71.9). Aortic Atherosclerosis (ICD10-I70.0). Electronically Signed   By: Lowella Grip III M.D.   On: 08/01/2017 13:44      Assessment/Plan  HTN GERD AAA repair  SBO - Small bowel protocol with Gastrografin and 8 hour delay film - NGT, NPO Hopefully this will resolve without need for surgical intervention.  We will follow this patient.  Kalman Drape, [facility] 08/01/2017, 2:59 PM Pager: [phone] Consults: [phone] Mon-Fri 7:00 am-4:30 pm Sat-Sun 7:00 am-11:30 am

## 2017-08-01 NOTE — Progress Notes (Signed)
Notified MD Emokpae results for the NG Tube verification xray were up. He said it was okay to use NGT.

## 2017-08-01 NOTE — ED Triage Notes (Signed)
Pt to ER for evaluation of epigastric pain onset Sunday with onset nausea and vomiting this morning. States took a hydrocodone prior to the vomiting starting. States has not been able to take medications due to nausea. Denies diarrhea. A/o x4.

## 2017-08-01 NOTE — H&P (Signed)
Patient Demographics:    Connie Davis, is a 51 y.o. female  MRN: [id number]   DOB - 11/06/1965  Admit Date - 08/01/2017  Outpatient Primary MD for the patient is [name], MD   Assessment & Plan:    Active Problems:   SBO (small bowel obstruction) (HCC)   1)SBO- transition zone in proximal ileum, General surgery consult appreciated, they recommend Small Bowel protocol with Gastrografin and 8 hour delay film,  NGT to intermittent low obstruction, NPO status and conservative management at this time, no fever, no leukocytosis use PRN antiemetics and pain relief  2)HTN-avoid abrupt discontinuation of beta-blocker, okay to give Coreg with clamping of NG tube, hold amlodipine and HCTZ,   may use IV Hydralazine 10 mg  Every 4 hours Prn for systolic blood pressure over 160 mmhg  3)Prophylaxis-IV Pepcid for GI prophylaxis while NG tube is in place, SCD/subcu heparin for DVT prophylaxis.   4)FEN/Hypokalemia-given n.p.o. status start D5 half normal,  Replace potassium  With History of - Reviewed by me  Past Medical History:  Diagnosis Date  . Abdominal aneurysm ([place])   . Blood transfusion without reported diagnosis   . GERD (gastroesophageal reflux disease)   . Hypertension       Past Surgical History:  Procedure Laterality Date  . ABDOMINAL AORTIC ANEURYSM REPAIR  2017  . BILATERAL SALPINGECTOMY Bilateral 01/01/2014   Procedure: BILATERAL SALPINGECTOMY;  Surgeon: Princess Bruins, MD;  Location: [facility];  Service: Gynecology;  Laterality: Bilateral;  . NO PAST SURGERIES    . ROBOTIC ASSISTED TOTAL HYSTERECTOMY N/A 01/01/2014   Procedure: ROBOTIC ASSISTED TOTAL HYSTERECTOMY;  Surgeon: Princess Bruins, MD;  Location: [facility];  Service: Gynecology;  Laterality: N/A;    Chief Complaint  Patient presents with    . Abdominal Pain      HPI:    Connie Davis  is a 51 y.o. female with past medical history relevant for previous hysterectomy, AAA repair and hypertension who presents with intermittent epigastric and periumbilical area pain from another 24 hours now.  Pain waxes and wanes.  No fevers no chills, patient had nausea, patient had emesis x1 episode after taking hydrocodone.  Emesis was without bile or blood.  Last BM was around 5 PM on 07/31/2017 it was formed.  No flatus.   No dysuria or urinary concerns    Review of systems:    In addition to the HPI above,   A full 12 point Review of 10 Systems was done, except as stated above, all other Review of 10 Systems were negative.    Social History:  Reviewed by me    Social History   Tobacco Use  . Smoking status: Never Smoker  . Smokeless tobacco: Never Used  Substance Use Topics  . Alcohol use: No     Family History :  Reviewed by me    Family History  Problem Relation Age of Onset  . Colon cancer Neg  Hx      Home Medications:   Prior to Admission medications   Medication Sig Start Date End Date Taking? Authorizing Provider  amLODipine (NORVASC) 5 MG tablet Take 5 mg by mouth daily.   Yes [provider]  aspirin EC 81 MG tablet Take 81 mg by mouth every morning.    Yes [provider]  carvedilol (COREG) 25 MG tablet Take 25 mg by mouth 2 (two) times daily. 03/01/16  Yes [provider]  hydrochlorothiazide (HYDRODIURIL) 25 MG tablet Take 25 mg by mouth daily.   Yes [provider]  pantoprazole (PROTONIX) 40 MG tablet Take 40 mg by mouth daily.   Yes [provider]  Propylene Glycol (SYSTANE BALANCE OP) Place 1 drop into both eyes daily as needed. For dry eyes   Yes [provider]  acetaminophen (TYLENOL) 500 MG tablet Take 1 tablet (500 mg total) by mouth every 6 (six) hours as needed for mild pain or moderate pain. Patient not taking: Reported on 05/02/2017 02/11/16    Antonietta Breach, PA-C  amLODipine (NORVASC) 10 MG tablet Take 10 mg by mouth daily. 03/01/16 03/01/17  [provider]  metoCLOPramide (REGLAN) 10 MG tablet Take 1 tablet (10 mg total) by mouth every 6 (six) hours. Patient not taking: Reported on 05/02/2017 04/02/16   Anne Ng, PA-C  pantoprazole (PROTONIX) 20 MG tablet Take 1 tablet (20 mg total) by mouth daily. Patient not taking: Reported on 08/01/2017 02/11/16   Antonietta Breach, PA-C     Allergies:    No Known Allergies   Physical Exam:   Vitals  Blood pressure 133/79, pulse 68, temperature 98 F (36.7 C), temperature source Oral, resp. rate 12, last menstrual period 12/22/2013, SpO2 94 %.  Physical Examination: General appearance - alert, well appearing, and in no distress Mental status - alert, oriented to person, place, and time Eyes - sclera anicteric Neck - supple, no JVD elevation , Chest - clear  to auscultation bilaterally, symmetrical air movement,  Heart - S1 and S2 normal,  Abdomen - soft, generalized tenderness, no rebound no guarding, midline and periumbilical abdominal scar with keloid noted, bowel sounds are diminished Neurological - screening mental status exam normal, neck supple without rigidity, cranial nerves II through XII intact, DTR's normal and symmetric Extremities - no pedal edema noted, intact peripheral pulses  Skin - warm, dry Psych-affect is appropriate   Data Review:    CBC Recent Labs  Lab 08/01/17 0826  WBC 6.4  HGB 13.8  HCT 40.8  PLT 213  MCV 76.1*  MCH 25.7*  MCHC 33.8  RDW 15.7*   ------------------------------------------------------------------------------------------------------------------  Chemistries  Recent Labs  Lab 08/01/17 0826  NA 137  K 3.1*  CL 95*  CO2 29  GLUCOSE 96  BUN 10  CREATININE 0.91  CALCIUM 10.0  AST 19  ALT 15  ALKPHOS 108  BILITOT 0.5    ------------------------------------------------------------------------------------------------------------------ CrCl cannot be calculated (Unknown ideal weight.). ------------------------------------------------------------------------------------------------------------------ No results for input(s): TSH, T4TOTAL, T3FREE, THYROIDAB in the last 72 hours.  Invalid input(s): FREET3   Coagulation profile No results for input(s): INR, PROTIME in the last 168 hours. ------------------------------------------------------------------------------------------------------------------- No results for input(s): DDIMER in the last 72 hours. -------------------------------------------------------------------------------------------------------------------  Cardiac Enzymes No results for input(s): CKMB, TROPONINI, MYOGLOBIN in the last 168 hours.  Invalid input(s): CK ------------------------------------------------------------------------------------------------------------------ No results found for: BNP   ---------------------------------------------------------------------------------------------------------------  Urinalysis    Component Value Date/Time   COLORURINE YELLOW 08/01/2017 [place]  08/01/2017 1200   LABSPEC 1.019 08/01/2017 1200   PHURINE 7.0 08/01/2017 1200   GLUCOSEU NEGATIVE 08/01/2017 1200   HGBUR NEGATIVE 08/01/2017 1200   BILIRUBINUR NEGATIVE 08/01/2017 1200   KETONESUR 5 (A) 08/01/2017 1200   PROTEINUR NEGATIVE 08/01/2017 1200   NITRITE NEGATIVE 08/01/2017 1200   LEUKOCYTESUR NEGATIVE 08/01/2017 1200    ----------------------------------------------------------------------------------------------------------------   Imaging Results:    Ct Abdomen Pelvis W Contrast  Result Date: 08/01/2017 CLINICAL DATA:  Abdominal pain with nausea and vomiting EXAM: CT ABDOMEN AND PELVIS WITH CONTRAST TECHNIQUE: Multidetector CT imaging of the abdomen and  pelvis was performed using the standard protocol following bolus administration of intravenous contrast. CONTRAST:  157mL ISOVUE-300 IOPAMIDOL (ISOVUE-300) INJECTION 61% COMPARISON:  April 02, 2016 and February 17, 2016 FINDINGS: Lower chest: Lung bases are clear. Hepatobiliary: No focal liver lesions are evident. Gallbladder wall does thickened. There is no biliary duct dilatation. Pancreas: No pancreatic mass or inflammatory focus. Spleen: No splenic lesions are evident. Adrenals/Urinary Tract: Adrenals appear unremarkable bilaterally. There is evidence of chronic decreased attenuation in the lateral aspect of the right kidney with scarring and localized right lateral calyseal dilatation, similar in appearance to prior studies. Question prior infarct in this area versus dysplasia. There is no well-defined renal mass on either side. There is mild chronic fullness of the left renal collecting system. There is no similar fullness of the right renal collecting system. There is no renal or ureteral calculus on either side. Urinary bladder is midline with wall thickness within normal limits. Stomach/Bowel: There is dilatation of multiple loops of small bowel with a transition zone noted in the proximal ileum region consistent with a degree of small bowel obstruction. Several loops of jejunum show mild wall thickening. There is no free air or portal venous air. There are scattered colonic diverticula without diverticulitis. Vascular/Lymphatic: The patient has had a previous aorto bi-iliac bypass graft. In comparison with the previous study, there is considerably less fluid surrounding the iliac arteries. There remains dilatation of the aorta at the level of the renal arteries with a maximum transverse diameter of 3.8 x 3.7 cm. There remains dilatation of the aorta slightly more distally measuring 3.5 x 3.5 cm. There is periaortic fluid as well as areas of calcification. Mild fluid surrounds the proximal common iliac  arteries, much less than on prior study. There is no contrast extravasation. The major mesenteric arteries appear patent, although there is narrowing of the proximal right renal artery. No adenopathy is evident in the abdomen or pelvis. Reproductive: Uterus is absent. There is no pelvic mass. There is moderate free fluid in the dependent portion of the pelvis. Other: There is no periappendiceal region inflammation. No abscess is evident in the abdomen or pelvis. There is a small ventral hernia containing only fat. No ascites is seen outside of the pelvis. Musculoskeletal: There are no blastic or lytic bone lesions. There is no intramuscular or abdominal wall lesions evident. IMPRESSION: 1. Evidence of a degree of small bowel obstruction with transition zone in the proximal ileum region. No free air. 2 status post aorta bi-iliac repair. There is much less fluid surrounding the proximal iliac arteries compared to the previous study. Apparent retroperitoneal hematoma seen previously has resolved. There remains aneurysmal dilatation in the mid the distal aorta with a mean maximum transverse diameter at the level of the renal arteries measuring 3.8 x 3.7 cm. Recommend followup by ultrasound in 2 years. This recommendation follows ACR consensus guidelines: White Paper of the ACR  Incidental Findings Committee II on Vascular Findings. J Am Coll Radiol 2013; 10:789-794. There is no periaortic fluid. There is atherosclerotic change in the aorta. 3.  Moderate ascites in the pelvis.  No ascites elsewhere. 4. Question prior infarct with scarring in the lateral right kidney. There may be a degree of dysplasia in this area. This appearance is essentially stable compared to previous studies. 5. No renal or ureteral calculus. Mild fullness of the left renal collecting system is chronic and potentially may be due to previous scarring involving the left ureter due to the previous aneurysm. This finding has not progressed from prior  study. 6.  Uterus absent. 7.  Small ventral hernia containing only fat. Aortic aneurysm NOS (ICD10-I71.9). Aortic Atherosclerosis (ICD10-I70.0). Electronically Signed   By: Lowella Grip III M.D.   On: 08/01/2017 13:44    Radiological Exams on Admission: Ct Abdomen Pelvis W Contrast  Result Date: 08/01/2017 CLINICAL DATA:  Abdominal pain with nausea and vomiting EXAM: CT ABDOMEN AND PELVIS WITH CONTRAST TECHNIQUE: Multidetector CT imaging of the abdomen and pelvis was performed using the standard protocol following bolus administration of intravenous contrast. CONTRAST:  152mL ISOVUE-300 IOPAMIDOL (ISOVUE-300) INJECTION 61% COMPARISON:  April 02, 2016 and February 17, 2016 FINDINGS: Lower chest: Lung bases are clear. Hepatobiliary: No focal liver lesions are evident. Gallbladder wall does thickened. There is no biliary duct dilatation. Pancreas: No pancreatic mass or inflammatory focus. Spleen: No splenic lesions are evident. Adrenals/Urinary Tract: Adrenals appear unremarkable bilaterally. There is evidence of chronic decreased attenuation in the lateral aspect of the right kidney with scarring and localized right lateral calyseal dilatation, similar in appearance to prior studies. Question prior infarct in this area versus dysplasia. There is no well-defined renal mass on either side. There is mild chronic fullness of the left renal collecting system. There is no similar fullness of the right renal collecting system. There is no renal or ureteral calculus on either side. Urinary bladder is midline with wall thickness within normal limits. Stomach/Bowel: There is dilatation of multiple loops of small bowel with a transition zone noted in the proximal ileum region consistent with a degree of small bowel obstruction. Several loops of jejunum show mild wall thickening. There is no free air or portal venous air. There are scattered colonic diverticula without diverticulitis. Vascular/Lymphatic: The patient has  had a previous aorto bi-iliac bypass graft. In comparison with the previous study, there is considerably less fluid surrounding the iliac arteries. There remains dilatation of the aorta at the level of the renal arteries with a maximum transverse diameter of 3.8 x 3.7 cm. There remains dilatation of the aorta slightly more distally measuring 3.5 x 3.5 cm. There is periaortic fluid as well as areas of calcification. Mild fluid surrounds the proximal common iliac arteries, much less than on prior study. There is no contrast extravasation. The major mesenteric arteries appear patent, although there is narrowing of the proximal right renal artery. No adenopathy is evident in the abdomen or pelvis. Reproductive: Uterus is absent. There is no pelvic mass. There is moderate free fluid in the dependent portion of the pelvis. Other: There is no periappendiceal region inflammation. No abscess is evident in the abdomen or pelvis. There is a small ventral hernia containing only fat. No ascites is seen outside of the pelvis. Musculoskeletal: There are no blastic or lytic bone lesions. There is no intramuscular or abdominal wall lesions evident. IMPRESSION: 1. Evidence of a degree of small bowel obstruction with transition zone in the  proximal ileum region. No free air. 2 status post aorta bi-iliac repair. There is much less fluid surrounding the proximal iliac arteries compared to the previous study. Apparent retroperitoneal hematoma seen previously has resolved. There remains aneurysmal dilatation in the mid the distal aorta with a mean maximum transverse diameter at the level of the renal arteries measuring 3.8 x 3.7 cm. Recommend followup by ultrasound in 2 years. This recommendation follows ACR consensus guidelines: White Paper of the ACR Incidental Findings Committee II on Vascular Findings. J Am Coll Radiol 2013; 10:789-794. There is no periaortic fluid. There is atherosclerotic change in the aorta. 3.  Moderate ascites in  the pelvis.  No ascites elsewhere. 4. Question prior infarct with scarring in the lateral right kidney. There may be a degree of dysplasia in this area. This appearance is essentially stable compared to previous studies. 5. No renal or ureteral calculus. Mild fullness of the left renal collecting system is chronic and potentially may be due to previous scarring involving the left ureter due to the previous aneurysm. This finding has not progressed from prior study. 6.  Uterus absent. 7.  Small ventral hernia containing only fat. Aortic aneurysm NOS (ICD10-I71.9). Aortic Atherosclerosis (ICD10-I70.0). Electronically Signed   By: Lowella Grip III M.D.   On: 08/01/2017 13:44    DVT Prophylaxis -SCD/heparin AM Labs Ordered, also please review Full Orders  Family Communication: Admission, patients condition and plan of care including tests being ordered have been discussed with the patient who indicate understanding and agree with the plan   Code Status - Full Code  Likely DC to  Home   Condition   Stable,  Roxan Hockey M.D on 08/01/2017 at 3:26 PM   Between 7am to 7pm - Pager - [phone] After 7pm go to [email or web address] - password [email or web address]  [facility] - Office  [phone]  Voice Recognition Viviann Spare dictation system was used to create this note, attempts have been made to correct errors. Please contact the author with questions and/or clarifications.

## 2017-08-01 NOTE — Progress Notes (Signed)
Patient arrived to 6n14 alert and oriented, mild pain in abdomen, VSS, IV intact and saline locked. Husband by the bedside, oriented to room and staff, will continue to monitor.

## 2017-08-02 ENCOUNTER — Inpatient Hospital Stay (HOSPITAL_COMMUNITY): Payer: BLUE CROSS/BLUE SHIELD

## 2017-08-02 ENCOUNTER — Other Ambulatory Visit: Payer: Self-pay

## 2017-08-02 ENCOUNTER — Encounter (HOSPITAL_COMMUNITY): Payer: Self-pay | Admitting: General Practice

## 2017-08-02 DIAGNOSIS — I10 Essential (primary) hypertension: Secondary | ICD-10-CM

## 2017-08-02 DIAGNOSIS — E876 Hypokalemia: Secondary | ICD-10-CM

## 2017-08-02 LAB — BASIC METABOLIC PANEL
ANION GAP: 8 (ref 5–15)
BUN: 6 mg/dL (ref 6–20)
CALCIUM: 9.3 mg/dL (ref 8.9–10.3)
CO2: 27 mmol/L (ref 22–32)
Chloride: 103 mmol/L (ref 101–111)
Creatinine, Ser: 0.78 mg/dL (ref 0.44–1.00)
GLUCOSE: 125 mg/dL — AB (ref 65–99)
Potassium: 3.5 mmol/L (ref 3.5–5.1)
Sodium: 138 mmol/L (ref 135–145)

## 2017-08-02 LAB — CBC
HCT: 41.6 % (ref 36.0–46.0)
HEMOGLOBIN: 13.8 g/dL (ref 12.0–15.0)
MCH: 25.3 pg — ABNORMAL LOW (ref 26.0–34.0)
MCHC: 33.2 g/dL (ref 30.0–36.0)
MCV: 76.2 fL — ABNORMAL LOW (ref 78.0–100.0)
Platelets: 214 10*3/uL (ref 150–400)
RBC: 5.46 MIL/uL — AB (ref 3.87–5.11)
RDW: 15.6 % — ABNORMAL HIGH (ref 11.5–15.5)
WBC: 5.8 10*3/uL (ref 4.0–10.5)

## 2017-08-02 LAB — HIV ANTIBODY (ROUTINE TESTING W REFLEX): HIV SCREEN 4TH GENERATION: NONREACTIVE

## 2017-08-02 NOTE — Progress Notes (Signed)
Patient ID: Connie Davis, female   DOB: 12/26/1965, 51 y.o.   MRN: [id number]  PROGRESS NOTE    Connie Davis  EGB:[id number] DOB: Jul 24, 1966 DOA: 08/01/2017 PCP: [name], MD   Brief Narrative:  51 year old female with past medical history of hysterectomy, AAA repair and hypertension presented with abdominal pain with vomiting.  She was found to have small bowel obstruction.  General surgery was consulted.   Assessment & Plan:   Active Problems:   SBO (small bowel obstruction) (HCC)   HTN (hypertension)   Hypokalemia    SBO -General surgery following.  Continue NG tube.  Continue n.p.o. -Continue IV fluids and pain management  HTN -Monitor blood pressure. -Continue Coreg.  PRN IV antihypertensives if needed  Hypokalemia -Improved   DVT prophylaxis: Heparin Code Status: Full Family Communication: None at bedside Disposition Plan: Depends on clinical outcome  Consultants: Surgery  Procedures: None  Antimicrobials: None   Subjective: Patient seen and examined at bedside.  She is still nauseous and has not had a bowel movement this morning.  No overnight fever.  Abdominal pain is improving.  Objective: Vitals:   08/01/17 2228 08/02/17 0252 08/02/17 0326 08/02/17 0551  BP: (!) 150/99 (!) 170/94 (!) 165/85 (!) 147/88  Pulse: 69 76 76 86  Resp: 16 18  17   Temp: 98.2 F (36.8 C) 98.6 F (37 C)  98.7 F (37.1 C)  TempSrc: Oral Oral  Oral  SpO2: 98% 99%  99%    Intake/Output Summary (Last 24 hours) at 08/02/2017 1026 Last data filed at 08/02/2017 0551 Gross per 24 hour  Intake 2165.42 ml  Output -  Net 2165.42 ml   There were no vitals filed for this visit.  Examination:  General exam: Appears calm and comfortable; has NG tube Respiratory system: Bilateral decreased breath sound at bases Cardiovascular system: S1 & S2 heard, rate controlled  gastrointestinal system: Abdomen is distended, soft and mildly tender in the periumbilical region.  Bowel  sounds sluggish  Extremities: No cyanosis, clubbing, edema    Data Reviewed: I have personally reviewed following labs and imaging studies  CBC: Recent Labs  Lab 08/01/17 0826 08/02/17 0559  WBC 6.4 5.8  HGB 13.8 13.8  HCT 40.8 41.6  MCV 76.1* 76.2*  PLT 213 371   Basic Metabolic Panel: Recent Labs  Lab 08/01/17 0826 08/02/17 0559  NA 137 138  K 3.1* 3.5  CL 95* 103  CO2 29 27  GLUCOSE 96 125*  BUN 10 6  CREATININE 0.91 0.78  CALCIUM 10.0 9.3   GFR: CrCl cannot be calculated (Unknown ideal weight.). Liver Function Tests: Recent Labs  Lab 08/01/17 0826  AST 19  ALT 15  ALKPHOS 108  BILITOT 0.5  PROT 8.6*  ALBUMIN 3.7   Recent Labs  Lab 08/01/17 0826  LIPASE 27   No results for input(s): AMMONIA in the last 168 hours. Coagulation Profile: No results for input(s): INR, PROTIME in the last 168 hours. Cardiac Enzymes: No results for input(s): CKTOTAL, CKMB, CKMBINDEX, TROPONINI in the last 168 hours. BNP (last 3 results) No results for input(s): PROBNP in the last 8760 hours. HbA1C: No results for input(s): HGBA1C in the last 72 hours. CBG: No results for input(s): GLUCAP in the last 168 hours. Lipid Profile: No results for input(s): CHOL, HDL, LDLCALC, TRIG, CHOLHDL, LDLDIRECT in the last 72 hours. Thyroid Function Tests: No results for input(s): TSH, T4TOTAL, FREET4, T3FREE, THYROIDAB in the last 72 hours. Anemia Panel: No results for input(s): VITAMINB12,  FOLATE, FERRITIN, TIBC, IRON, RETICCTPCT in the last 72 hours. Sepsis Labs: No results for input(s): PROCALCITON, LATICACIDVEN in the last 168 hours.  No results found for this or any previous visit (from the past 240 hour(s)).       Radiology Studies: Ct Abdomen Pelvis W Contrast  Result Date: 08/01/2017 CLINICAL DATA:  Abdominal pain with nausea and vomiting EXAM: CT ABDOMEN AND PELVIS WITH CONTRAST TECHNIQUE: Multidetector CT imaging of the abdomen and pelvis was performed using the  standard protocol following bolus administration of intravenous contrast. CONTRAST:  130mL ISOVUE-300 IOPAMIDOL (ISOVUE-300) INJECTION 61% COMPARISON:  April 02, 2016 and February 17, 2016 FINDINGS: Lower chest: Lung bases are clear. Hepatobiliary: No focal liver lesions are evident. Gallbladder wall does thickened. There is no biliary duct dilatation. Pancreas: No pancreatic mass or inflammatory focus. Spleen: No splenic lesions are evident. Adrenals/Urinary Tract: Adrenals appear unremarkable bilaterally. There is evidence of chronic decreased attenuation in the lateral aspect of the right kidney with scarring and localized right lateral calyseal dilatation, similar in appearance to prior studies. Question prior infarct in this area versus dysplasia. There is no well-defined renal mass on either side. There is mild chronic fullness of the left renal collecting system. There is no similar fullness of the right renal collecting system. There is no renal or ureteral calculus on either side. Urinary bladder is midline with wall thickness within normal limits. Stomach/Bowel: There is dilatation of multiple loops of small bowel with a transition zone noted in the proximal ileum region consistent with a degree of small bowel obstruction. Several loops of jejunum show mild wall thickening. There is no free air or portal venous air. There are scattered colonic diverticula without diverticulitis. Vascular/Lymphatic: The patient has had a previous aorto bi-iliac bypass graft. In comparison with the previous study, there is considerably less fluid surrounding the iliac arteries. There remains dilatation of the aorta at the level of the renal arteries with a maximum transverse diameter of 3.8 x 3.7 cm. There remains dilatation of the aorta slightly more distally measuring 3.5 x 3.5 cm. There is periaortic fluid as well as areas of calcification. Mild fluid surrounds the proximal common iliac arteries, much less than on prior  study. There is no contrast extravasation. The major mesenteric arteries appear patent, although there is narrowing of the proximal right renal artery. No adenopathy is evident in the abdomen or pelvis. Reproductive: Uterus is absent. There is no pelvic mass. There is moderate free fluid in the dependent portion of the pelvis. Other: There is no periappendiceal region inflammation. No abscess is evident in the abdomen or pelvis. There is a small ventral hernia containing only fat. No ascites is seen outside of the pelvis. Musculoskeletal: There are no blastic or lytic bone lesions. There is no intramuscular or abdominal wall lesions evident. IMPRESSION: 1. Evidence of a degree of small bowel obstruction with transition zone in the proximal ileum region. No free air. 2 status post aorta bi-iliac repair. There is much less fluid surrounding the proximal iliac arteries compared to the previous study. Apparent retroperitoneal hematoma seen previously has resolved. There remains aneurysmal dilatation in the mid the distal aorta with a mean maximum transverse diameter at the level of the renal arteries measuring 3.8 x 3.7 cm. Recommend followup by ultrasound in 2 years. This recommendation follows ACR consensus guidelines: White Paper of the ACR Incidental Findings Committee II on Vascular Findings. J Am Coll Radiol 2013; 10:789-794. There is no periaortic fluid. There is atherosclerotic change  in the aorta. 3.  Moderate ascites in the pelvis.  No ascites elsewhere. 4. Question prior infarct with scarring in the lateral right kidney. There may be a degree of dysplasia in this area. This appearance is essentially stable compared to previous studies. 5. No renal or ureteral calculus. Mild fullness of the left renal collecting system is chronic and potentially may be due to previous scarring involving the left ureter due to the previous aneurysm. This finding has not progressed from prior study. 6.  Uterus absent. 7.  Small  ventral hernia containing only fat. Aortic aneurysm NOS (ICD10-I71.9). Aortic Atherosclerosis (ICD10-I70.0). Electronically Signed   By: Lowella Grip III M.D.   On: 08/01/2017 13:44   Dg Abd Portable 1v-small Bowel Obstruction Protocol-initial, 8 Hr Delay  Result Date: 08/02/2017 CLINICAL DATA:  Small bowel obstruction. EXAM: PORTABLE ABDOMEN - 1 VIEW COMPARISON:  Body CT 08/01/2017 FINDINGS: Enteric catheter in the expected location of gastric body, side hole at the GE junction. Persistent mild dilation of small bowel loops. Normal appearance of the colon. Moderate amount of formed stool in the right colon. Postsurgical changes in the abdomen. IMPRESSION: Enteric catheter and the expected location of gastric body with side hole at the GE junction. Ongoing small bowel obstruction. Electronically Signed   By: Fidela Salisbury M.D.   On: 08/02/2017 03:26   Dg Abd Portable 1v-small Bowel Protocol-position Verification  Result Date: 08/01/2017 CLINICAL DATA:  Nasogastric tube placement EXAM: PORTABLE ABDOMEN - 1 VIEW COMPARISON:  CT abdomen and pelvis August 01, 2017 FINDINGS: Nasogastric tube tip and side port are in the proximal stomach. Moderate stool is seen in the colon. There are loops of mildly dilated bowel in the upper abdomen. Visualized lung regions are clear. IMPRESSION: Nasogastric tube tip and side port in proximal stomach. Mild bowel dilatation in visualized upper abdomen. Moderate stool in colon. Visualized lungs clear. Electronically Signed   By: Lowella Grip III M.D.   On: 08/01/2017 16:45        Scheduled Meds: . aspirin EC  81 mg Oral Daily  . bisacodyl  10 mg Rectal Once  . carvedilol  25 mg Oral BID  . heparin  5,000 Units Subcutaneous Q8H  . senna  1 tablet Oral BID  . sodium chloride flush  3 mL Intravenous Q12H   Continuous Infusions: . sodium chloride    . dextrose 5 % and 0.45 % NaCl with KCl 40 mEq/L 125 mL/hr at 08/02/17 0018  . famotidine (PEPCID) IV  20 mg (08/02/17 1009)     LOS: 1 day        Aline August, MD [facility] Pager [phone]  If 7PM-7AM, please contact night-coverage [email or web address] Password [email or web address] 08/02/2017, 10:26 AM

## 2017-08-02 NOTE — Progress Notes (Signed)
[facility]/Trauma Progress Note      Assessment/Plan HTN GERD AAA repair  SBO - Small bowel protocol with Gastrografin, 8 hour delay film did not show any contrast, suspect it went out tube  - NGT, NPO  Plan: pt has good BS and is not distended and nontender. Wait for return of bowel function.     LOS: 1 day    Subjective:  CC: abdominal pain  Pain is improved. Pt vomiting over tube last night. No flatus or BM.   Objective: Vital signs in last 24 hours: Temp:  [98 F (36.7 C)-98.7 F (37.1 C)] 98.7 F (37.1 C) (12/06 0551) Pulse Rate:  [68-86] 86 (12/06 0551) Resp:  [12-18] 17 (12/06 0551) BP: (133-170)/(79-101) 147/88 (12/06 0551) SpO2:  [94 %-100 %] 99 % (12/06 0551) Last BM Date: 07/31/17  Intake/Output from previous day: 12/05 0701 - 12/06 0700 In: 2165.4 [I.V.:1635.4; NG/GT:30; IV Piggyback:500] Out: -  Intake/Output this shift: No intake/output data recorded.  PE: Gen:  Alert, NAD, pleasant, cooperative HEENT: NGT in place, minimal light green output in canister  Card:  RRR, no M/G/R heard Pulm:  Rate and effort normal Abd: Soft, not distended, +BS, no HSM, very mild generalized TTP without guarding Skin: no rashes noted, warm and dry   Anti-infectives: Anti-infectives (From admission, onward)   None      Lab Results:  Recent Labs    08/01/17 0826 08/02/17 0559  WBC 6.4 5.8  HGB 13.8 13.8  HCT 40.8 41.6  PLT 213 214   BMET Recent Labs    08/01/17 0826 08/02/17 0559  NA 137 138  K 3.1* 3.5  CL 95* 103  CO2 29 27  GLUCOSE 96 125*  BUN 10 6  CREATININE 0.91 0.78  CALCIUM 10.0 9.3   PT/INR No results for input(s): LABPROT, INR in the last 72 hours. CMP     Component Value Date/Time   NA 138 08/02/2017 0559   K 3.5 08/02/2017 0559   CL 103 08/02/2017 0559   CO2 27 08/02/2017 0559   GLUCOSE 125 (H) 08/02/2017 0559   BUN 6 08/02/2017 0559   CREATININE 0.78 08/02/2017 0559   CALCIUM 9.3 08/02/2017 0559   PROT  8.6 (H) 08/01/2017 0826   ALBUMIN 3.7 08/01/2017 0826   AST 19 08/01/2017 0826   ALT 15 08/01/2017 0826   ALKPHOS 108 08/01/2017 0826   BILITOT 0.5 08/01/2017 0826   GFRNONAA >60 08/02/2017 0559   GFRAA >60 08/02/2017 0559   Lipase     Component Value Date/Time   LIPASE 27 08/01/2017 0826    Studies/Results: Ct Abdomen Pelvis W Contrast  Result Date: 08/01/2017 CLINICAL DATA:  Abdominal pain with nausea and vomiting EXAM: CT ABDOMEN AND PELVIS WITH CONTRAST TECHNIQUE: Multidetector CT imaging of the abdomen and pelvis was performed using the standard protocol following bolus administration of intravenous contrast. CONTRAST:  168mL ISOVUE-300 IOPAMIDOL (ISOVUE-300) INJECTION 61% COMPARISON:  April 02, 2016 and February 17, 2016 FINDINGS: Lower chest: Lung bases are clear. Hepatobiliary: No focal liver lesions are evident. Gallbladder wall does thickened. There is no biliary duct dilatation. Pancreas: No pancreatic mass or inflammatory focus. Spleen: No splenic lesions are evident. Adrenals/Urinary Tract: Adrenals appear unremarkable bilaterally. There is evidence of chronic decreased attenuation in the lateral aspect of the right kidney with scarring and localized right lateral calyseal dilatation, similar in appearance to prior studies. Question prior infarct in this area versus dysplasia. There is no well-defined renal mass on either side.  There is mild chronic fullness of the left renal collecting system. There is no similar fullness of the right renal collecting system. There is no renal or ureteral calculus on either side. Urinary bladder is midline with wall thickness within normal limits. Stomach/Bowel: There is dilatation of multiple loops of small bowel with a transition zone noted in the proximal ileum region consistent with a degree of small bowel obstruction. Several loops of jejunum show mild wall thickening. There is no free air or portal venous air. There are scattered colonic diverticula  without diverticulitis. Vascular/Lymphatic: The patient has had a previous aorto bi-iliac bypass graft. In comparison with the previous study, there is considerably less fluid surrounding the iliac arteries. There remains dilatation of the aorta at the level of the renal arteries with a maximum transverse diameter of 3.8 x 3.7 cm. There remains dilatation of the aorta slightly more distally measuring 3.5 x 3.5 cm. There is periaortic fluid as well as areas of calcification. Mild fluid surrounds the proximal common iliac arteries, much less than on prior study. There is no contrast extravasation. The major mesenteric arteries appear patent, although there is narrowing of the proximal right renal artery. No adenopathy is evident in the abdomen or pelvis. Reproductive: Uterus is absent. There is no pelvic mass. There is moderate free fluid in the dependent portion of the pelvis. Other: There is no periappendiceal region inflammation. No abscess is evident in the abdomen or pelvis. There is a small ventral hernia containing only fat. No ascites is seen outside of the pelvis. Musculoskeletal: There are no blastic or lytic bone lesions. There is no intramuscular or abdominal wall lesions evident. IMPRESSION: 1. Evidence of a degree of small bowel obstruction with transition zone in the proximal ileum region. No free air. 2 status post aorta bi-iliac repair. There is much less fluid surrounding the proximal iliac arteries compared to the previous study. Apparent retroperitoneal hematoma seen previously has resolved. There remains aneurysmal dilatation in the mid the distal aorta with a mean maximum transverse diameter at the level of the renal arteries measuring 3.8 x 3.7 cm. Recommend followup by ultrasound in 2 years. This recommendation follows ACR consensus guidelines: White Paper of the ACR Incidental Findings Committee II on Vascular Findings. J Am Coll Radiol 2013; 10:789-794. There is no periaortic fluid. There is  atherosclerotic change in the aorta. 3.  Moderate ascites in the pelvis.  No ascites elsewhere. 4. Question prior infarct with scarring in the lateral right kidney. There may be a degree of dysplasia in this area. This appearance is essentially stable compared to previous studies. 5. No renal or ureteral calculus. Mild fullness of the left renal collecting system is chronic and potentially may be due to previous scarring involving the left ureter due to the previous aneurysm. This finding has not progressed from prior study. 6.  Uterus absent. 7.  Small ventral hernia containing only fat. Aortic aneurysm NOS (ICD10-I71.9). Aortic Atherosclerosis (ICD10-I70.0). Electronically Signed   By: Lowella Grip III M.D.   On: 08/01/2017 13:44   Dg Abd Portable 1v-small Bowel Obstruction Protocol-initial, 8 Hr Delay  Result Date: 08/02/2017 CLINICAL DATA:  Small bowel obstruction. EXAM: PORTABLE ABDOMEN - 1 VIEW COMPARISON:  Body CT 08/01/2017 FINDINGS: Enteric catheter in the expected location of gastric body, side hole at the GE junction. Persistent mild dilation of small bowel loops. Normal appearance of the colon. Moderate amount of formed stool in the right colon. Postsurgical changes in the abdomen. IMPRESSION: Enteric catheter and  the expected location of gastric body with side hole at the GE junction. Ongoing small bowel obstruction. Electronically Signed   By: Fidela Salisbury M.D.   On: 08/02/2017 03:26   Dg Abd Portable 1v-small Bowel Protocol-position Verification  Result Date: 08/01/2017 CLINICAL DATA:  Nasogastric tube placement EXAM: PORTABLE ABDOMEN - 1 VIEW COMPARISON:  CT abdomen and pelvis August 01, 2017 FINDINGS: Nasogastric tube tip and side port are in the proximal stomach. Moderate stool is seen in the colon. There are loops of mildly dilated bowel in the upper abdomen. Visualized lung regions are clear. IMPRESSION: Nasogastric tube tip and side port in proximal stomach. Mild bowel  dilatation in visualized upper abdomen. Moderate stool in colon. Visualized lungs clear. Electronically Signed   By: Lowella Grip III M.D.   On: 08/01/2017 16:45      Kalman Drape , [facility] Surgery 08/02/2017, 8:58 AM Pager: [phone] Consults: [phone] Mon-Fri 7:00 am-4:30 pm Sat-Sun 7:00 am-11:30 am

## 2017-08-03 ENCOUNTER — Inpatient Hospital Stay (HOSPITAL_COMMUNITY): Payer: BLUE CROSS/BLUE SHIELD

## 2017-08-03 LAB — BASIC METABOLIC PANEL
Anion gap: 7 (ref 5–15)
BUN: 5 mg/dL — ABNORMAL LOW (ref 6–20)
CALCIUM: 9.7 mg/dL (ref 8.9–10.3)
CHLORIDE: 106 mmol/L (ref 101–111)
CO2: 26 mmol/L (ref 22–32)
CREATININE: 0.85 mg/dL (ref 0.44–1.00)
GFR calc non Af Amer: >60 mL/min (ref >60)
Glucose, Bld: 121 mg/dL — ABNORMAL HIGH (ref 65–99)
Potassium: 3.8 mmol/L (ref 3.5–5.1)
SODIUM: 139 mmol/L (ref 135–145)

## 2017-08-03 LAB — MAGNESIUM: MAGNESIUM: 2.1 mg/dL (ref 1.7–2.4)

## 2017-08-03 NOTE — Progress Notes (Signed)
Patient ID: Connie Davis, female   DOB: 13-Oct-1965, 51 y.o.   MRN: [id number]  PROGRESS NOTE    Connie Davis  KCL:[id number] DOB: 09/11/1965 DOA: 08/01/2017 PCP: [name], MD   Brief Narrative:  51 year old female with past medical history of hysterectomy, AAA repair and hypertension presented with abdominal pain with vomiting.  She was found to have small bowel obstruction.  General surgery was consulted.   Assessment & Plan:   Active Problems:   SBO (small bowel obstruction) (HCC)   HTN (hypertension)   Hypokalemia    SBO -General surgery following.  Continue NG tube.  Continue n.p.o. -Continue IV fluids and pain management  HTN -Monitor blood pressure. -Continue Coreg.  PRN IV antihypertensives if needed  Hypokalemia -Improved   DVT prophylaxis: Heparin Code Status: Full Family Communication: None at bedside Disposition Plan: Depends on clinical outcome  Consultants: Surgery  Procedures: None  Antimicrobials: None   Subjective: Patient seen and examined at bedside.  She feels slightly better and states that she had one bowel movement.  No overnight fever.  Abdominal pain is improving.  Objective: Vitals:   08/02/17 1322 08/02/17 2111 08/03/17 0030 08/03/17 0450  BP: (!) 167/94 (!) 161/101 (!) 164/95 (!) 168/91  Pulse: 81 92 81 88  Resp: 18  18 18   Temp: 99.6 F (37.6 C)  99.7 F (37.6 C) 99.7 F (37.6 C)  TempSrc: Oral  Oral Oral  SpO2: 100%  98% 98%  Weight: 73.3 kg (161 lb 9.6 oz)     Height: 5\' 6"  (1.676 m)       Intake/Output Summary (Last 24 hours) at 08/03/2017 4944 Last data filed at 08/03/2017 0200 Gross per 24 hour  Intake 2568.75 ml  Output 600 ml  Net 1968.75 ml   Filed Weights   08/02/17 1322  Weight: 73.3 kg (161 lb 9.6 oz)    Examination:  General exam: Appears calm and comfortable; has NG tube Respiratory system: Bilateral decreased breath sound at bases Cardiovascular system: S1 & S2 heard, rate controlled    gastrointestinal system: Abdomen is distended, soft and nontender.  Bowel sounds sluggish  Extremities: No cyanosis, clubbing, edema    Data Reviewed: I have personally reviewed following labs and imaging studies  CBC: Recent Labs  Lab 08/01/17 0826 08/02/17 0559  WBC 6.4 5.8  HGB 13.8 13.8  HCT 40.8 41.6  MCV 76.1* 76.2*  PLT 213 967   Basic Metabolic Panel: Recent Labs  Lab 08/01/17 0826 08/02/17 0559 08/03/17 0529  NA 137 138 139  K 3.1* 3.5 3.8  CL 95* 103 106  CO2 29 27 26   GLUCOSE 96 125* 121*  BUN 10 6 5*  CREATININE 0.91 0.78 0.85  CALCIUM 10.0 9.3 9.7  MG  --   --  2.1   GFR: Estimated Creatinine Clearance: 81.1 mL/min (by C-G formula based on SCr of 0.85 mg/dL). Liver Function Tests: Recent Labs  Lab 08/01/17 0826  AST 19  ALT 15  ALKPHOS 108  BILITOT 0.5  PROT 8.6*  ALBUMIN 3.7   Recent Labs  Lab 08/01/17 0826  LIPASE 27   No results for input(s): AMMONIA in the last 168 hours. Coagulation Profile: No results for input(s): INR, PROTIME in the last 168 hours. Cardiac Enzymes: No results for input(s): CKTOTAL, CKMB, CKMBINDEX, TROPONINI in the last 168 hours. BNP (last 3 results) No results for input(s): PROBNP in the last 8760 hours. HbA1C: No results for input(s): HGBA1C in the last 72 hours. CBG: No  results for input(s): GLUCAP in the last 168 hours. Lipid Profile: No results for input(s): CHOL, HDL, LDLCALC, TRIG, CHOLHDL, LDLDIRECT in the last 72 hours. Thyroid Function Tests: No results for input(s): TSH, T4TOTAL, FREET4, T3FREE, THYROIDAB in the last 72 hours. Anemia Panel: No results for input(s): VITAMINB12, FOLATE, FERRITIN, TIBC, IRON, RETICCTPCT in the last 72 hours. Sepsis Labs: No results for input(s): PROCALCITON, LATICACIDVEN in the last 168 hours.  No results found for this or any previous visit (from the past 240 hour(s)).       Radiology Studies: Ct Abdomen Pelvis W Contrast  Result Date: 08/01/2017 CLINICAL  DATA:  Abdominal pain with nausea and vomiting EXAM: CT ABDOMEN AND PELVIS WITH CONTRAST TECHNIQUE: Multidetector CT imaging of the abdomen and pelvis was performed using the standard protocol following bolus administration of intravenous contrast. CONTRAST:  142mL ISOVUE-300 IOPAMIDOL (ISOVUE-300) INJECTION 61% COMPARISON:  April 02, 2016 and February 17, 2016 FINDINGS: Lower chest: Lung bases are clear. Hepatobiliary: No focal liver lesions are evident. Gallbladder wall does thickened. There is no biliary duct dilatation. Pancreas: No pancreatic mass or inflammatory focus. Spleen: No splenic lesions are evident. Adrenals/Urinary Tract: Adrenals appear unremarkable bilaterally. There is evidence of chronic decreased attenuation in the lateral aspect of the right kidney with scarring and localized right lateral calyseal dilatation, similar in appearance to prior studies. Question prior infarct in this area versus dysplasia. There is no well-defined renal mass on either side. There is mild chronic fullness of the left renal collecting system. There is no similar fullness of the right renal collecting system. There is no renal or ureteral calculus on either side. Urinary bladder is midline with wall thickness within normal limits. Stomach/Bowel: There is dilatation of multiple loops of small bowel with a transition zone noted in the proximal ileum region consistent with a degree of small bowel obstruction. Several loops of jejunum show mild wall thickening. There is no free air or portal venous air. There are scattered colonic diverticula without diverticulitis. Vascular/Lymphatic: The patient has had a previous aorto bi-iliac bypass graft. In comparison with the previous study, there is considerably less fluid surrounding the iliac arteries. There remains dilatation of the aorta at the level of the renal arteries with a maximum transverse diameter of 3.8 x 3.7 cm. There remains dilatation of the aorta slightly more  distally measuring 3.5 x 3.5 cm. There is periaortic fluid as well as areas of calcification. Mild fluid surrounds the proximal common iliac arteries, much less than on prior study. There is no contrast extravasation. The major mesenteric arteries appear patent, although there is narrowing of the proximal right renal artery. No adenopathy is evident in the abdomen or pelvis. Reproductive: Uterus is absent. There is no pelvic mass. There is moderate free fluid in the dependent portion of the pelvis. Other: There is no periappendiceal region inflammation. No abscess is evident in the abdomen or pelvis. There is a small ventral hernia containing only fat. No ascites is seen outside of the pelvis. Musculoskeletal: There are no blastic or lytic bone lesions. There is no intramuscular or abdominal wall lesions evident. IMPRESSION: 1. Evidence of a degree of small bowel obstruction with transition zone in the proximal ileum region. No free air. 2 status post aorta bi-iliac repair. There is much less fluid surrounding the proximal iliac arteries compared to the previous study. Apparent retroperitoneal hematoma seen previously has resolved. There remains aneurysmal dilatation in the mid the distal aorta with a mean maximum transverse diameter at the  level of the renal arteries measuring 3.8 x 3.7 cm. Recommend followup by ultrasound in 2 years. This recommendation follows ACR consensus guidelines: White Paper of the ACR Incidental Findings Committee II on Vascular Findings. J Am Coll Radiol 2013; 10:789-794. There is no periaortic fluid. There is atherosclerotic change in the aorta. 3.  Moderate ascites in the pelvis.  No ascites elsewhere. 4. Question prior infarct with scarring in the lateral right kidney. There may be a degree of dysplasia in this area. This appearance is essentially stable compared to previous studies. 5. No renal or ureteral calculus. Mild fullness of the left renal collecting system is chronic and  potentially may be due to previous scarring involving the left ureter due to the previous aneurysm. This finding has not progressed from prior study. 6.  Uterus absent. 7.  Small ventral hernia containing only fat. Aortic aneurysm NOS (ICD10-I71.9). Aortic Atherosclerosis (ICD10-I70.0). Electronically Signed   By: Lowella Grip III M.D.   On: 08/01/2017 13:44   Dg Chest Port 1 View  Result Date: 08/02/2017 CLINICAL DATA:  Productive cough. EXAM: PORTABLE CHEST 1 VIEW COMPARISON:  04/02/2016 FINDINGS: 1710 hours. The lungs are clear without focal pneumonia, edema, pneumothorax or pleural effusion. Cardiopericardial silhouette is at upper limits of normal for size. The visualized bony structures of the thorax are intact. The NG tube passes into the stomach although the distal tip position is not included on the film. Telemetry leads overlie the chest. IMPRESSION: No acute cardiopulmonary findings. Electronically Signed   By: Misty Stanley M.D.   On: 08/02/2017 19:29   Dg Abd Portable 1v-small Bowel Obstruction Protocol-initial, 8 Hr Delay  Result Date: 08/02/2017 CLINICAL DATA:  Small bowel obstruction. EXAM: PORTABLE ABDOMEN - 1 VIEW COMPARISON:  Body CT 08/01/2017 FINDINGS: Enteric catheter in the expected location of gastric body, side hole at the GE junction. Persistent mild dilation of small bowel loops. Normal appearance of the colon. Moderate amount of formed stool in the right colon. Postsurgical changes in the abdomen. IMPRESSION: Enteric catheter and the expected location of gastric body with side hole at the GE junction. Ongoing small bowel obstruction. Electronically Signed   By: Fidela Salisbury M.D.   On: 08/02/2017 03:26   Dg Abd Portable 1v-small Bowel Protocol-position Verification  Result Date: 08/01/2017 CLINICAL DATA:  Nasogastric tube placement EXAM: PORTABLE ABDOMEN - 1 VIEW COMPARISON:  CT abdomen and pelvis August 01, 2017 FINDINGS: Nasogastric tube tip and side port are in  the proximal stomach. Moderate stool is seen in the colon. There are loops of mildly dilated bowel in the upper abdomen. Visualized lung regions are clear. IMPRESSION: Nasogastric tube tip and side port in proximal stomach. Mild bowel dilatation in visualized upper abdomen. Moderate stool in colon. Visualized lungs clear. Electronically Signed   By: Lowella Grip III M.D.   On: 08/01/2017 16:45        Scheduled Meds: . aspirin EC  81 mg Oral Daily  . bisacodyl  10 mg Rectal Once  . carvedilol  25 mg Oral BID  . heparin  5,000 Units Subcutaneous Q8H  . senna  1 tablet Oral BID  . sodium chloride flush  3 mL Intravenous Q12H   Continuous Infusions: . sodium chloride    . dextrose 5 % and 0.45 % NaCl with KCl 40 mEq/L 125 mL/hr at 08/03/17 0326  . famotidine (PEPCID) IV 20 mg (08/03/17 0855)     LOS: 2 days        Makhya Arave Starla Link,  MD [facility] Pager [phone]  If 7PM-7AM, please contact night-coverage [email or web address] Password [email or web address] 08/03/2017, 9:17 AM

## 2017-08-03 NOTE — Progress Notes (Signed)
[facility] Progress Note     Subjective: CC: throat discomfort from NG tube  Denies abd pain. Reports 2 loose BMs. +flatus. Getting out of bed a little but not much. Denies distention, belching, nausea, vomiting.   Objective: Vital signs in last 24 hours: Temp:  [99.6 F (37.6 C)-99.7 F (37.6 C)] 99.7 F (37.6 C) (12/07 0450) Pulse Rate:  [81-92] 88 (12/07 0450) Resp:  [18] 18 (12/07 0450) BP: (161-168)/(91-101) 168/91 (12/07 0450) SpO2:  [98 %-100 %] 98 % (12/07 0450) Weight:  [73.3 kg (161 lb 9.6 oz)] 73.3 kg (161 lb 9.6 oz) (12/06 1322) Last BM Date: (08/02/2017)  Intake/Output from previous day: 12/06 0701 - 12/07 0700 In: 2568.8 [I.V.:2518.8; IV Piggyback:50] Out: 600 [Emesis/NG output:600] Intake/Output this shift: No intake/output data recorded.  PE: Gen:  Alert, NAD, pleasant Card:  Regular rate and rhythm, pedal pulses 2+ BL Pulm:  Normal effort, clear to auscultation bilaterally Abd: Soft, non-tender, non-distended, bowel sounds present in all 4 quadrants, prev laparotomy scar  NGT- 600 cc/24h Skin: warm and dry, no rashes  Psych: A&Ox3   Lab Results:  Recent Labs    08/01/17 0826 08/02/17 0559  WBC 6.4 5.8  HGB 13.8 13.8  HCT 40.8 41.6  PLT 213 214   BMET Recent Labs    08/02/17 0559 08/03/17 0529  NA 138 139  K 3.5 3.8  CL 103 106  CO2 27 26  GLUCOSE 125* 121*  BUN 6 5*  CREATININE 0.78 0.85  CALCIUM 9.3 9.7   PT/INR No results for input(s): LABPROT, INR in the last 72 hours. CMP     Component Value Date/Time   NA 139 08/03/2017 0529   K 3.8 08/03/2017 0529   CL 106 08/03/2017 0529   CO2 26 08/03/2017 0529   GLUCOSE 121 (H) 08/03/2017 0529   BUN 5 (L) 08/03/2017 0529   CREATININE 0.85 08/03/2017 0529   CALCIUM 9.7 08/03/2017 0529   PROT 8.6 (H) 08/01/2017 0826   ALBUMIN 3.7 08/01/2017 0826   AST 19 08/01/2017 0826   ALT 15 08/01/2017 0826   ALKPHOS 108 08/01/2017 0826   BILITOT 0.5 08/01/2017 0826   GFRNONAA >60  08/03/2017 0529   GFRAA >60 08/03/2017 0529   Lipase     Component Value Date/Time   LIPASE 27 08/01/2017 0826       Studies/Results: Ct Abdomen Pelvis W Contrast  Result Date: 08/01/2017 CLINICAL DATA:  Abdominal pain with nausea and vomiting EXAM: CT ABDOMEN AND PELVIS WITH CONTRAST TECHNIQUE: Multidetector CT imaging of the abdomen and pelvis was performed using the standard protocol following bolus administration of intravenous contrast. CONTRAST:  19mL ISOVUE-300 IOPAMIDOL (ISOVUE-300) INJECTION 61% COMPARISON:  April 02, 2016 and February 17, 2016 FINDINGS: Lower chest: Lung bases are clear. Hepatobiliary: No focal liver lesions are evident. Gallbladder wall does thickened. There is no biliary duct dilatation. Pancreas: No pancreatic mass or inflammatory focus. Spleen: No splenic lesions are evident. Adrenals/Urinary Tract: Adrenals appear unremarkable bilaterally. There is evidence of chronic decreased attenuation in the lateral aspect of the right kidney with scarring and localized right lateral calyseal dilatation, similar in appearance to prior studies. Question prior infarct in this area versus dysplasia. There is no well-defined renal mass on either side. There is mild chronic fullness of the left renal collecting system. There is no similar fullness of the right renal collecting system. There is no renal or ureteral calculus on either side. Urinary bladder is midline with wall thickness within normal  limits. Stomach/Bowel: There is dilatation of multiple loops of small bowel with a transition zone noted in the proximal ileum region consistent with a degree of small bowel obstruction. Several loops of jejunum show mild wall thickening. There is no free air or portal venous air. There are scattered colonic diverticula without diverticulitis. Vascular/Lymphatic: The patient has had a previous aorto bi-iliac bypass graft. In comparison with the previous study, there is considerably less fluid  surrounding the iliac arteries. There remains dilatation of the aorta at the level of the renal arteries with a maximum transverse diameter of 3.8 x 3.7 cm. There remains dilatation of the aorta slightly more distally measuring 3.5 x 3.5 cm. There is periaortic fluid as well as areas of calcification. Mild fluid surrounds the proximal common iliac arteries, much less than on prior study. There is no contrast extravasation. The major mesenteric arteries appear patent, although there is narrowing of the proximal right renal artery. No adenopathy is evident in the abdomen or pelvis. Reproductive: Uterus is absent. There is no pelvic mass. There is moderate free fluid in the dependent portion of the pelvis. Other: There is no periappendiceal region inflammation. No abscess is evident in the abdomen or pelvis. There is a small ventral hernia containing only fat. No ascites is seen outside of the pelvis. Musculoskeletal: There are no blastic or lytic bone lesions. There is no intramuscular or abdominal wall lesions evident. IMPRESSION: 1. Evidence of a degree of small bowel obstruction with transition zone in the proximal ileum region. No free air. 2 status post aorta bi-iliac repair. There is much less fluid surrounding the proximal iliac arteries compared to the previous study. Apparent retroperitoneal hematoma seen previously has resolved. There remains aneurysmal dilatation in the mid the distal aorta with a mean maximum transverse diameter at the level of the renal arteries measuring 3.8 x 3.7 cm. Recommend followup by ultrasound in 2 years. This recommendation follows ACR consensus guidelines: White Paper of the ACR Incidental Findings Committee II on Vascular Findings. J Am Coll Radiol 2013; 10:789-794. There is no periaortic fluid. There is atherosclerotic change in the aorta. 3.  Moderate ascites in the pelvis.  No ascites elsewhere. 4. Question prior infarct with scarring in the lateral right kidney. There may  be a degree of dysplasia in this area. This appearance is essentially stable compared to previous studies. 5. No renal or ureteral calculus. Mild fullness of the left renal collecting system is chronic and potentially may be due to previous scarring involving the left ureter due to the previous aneurysm. This finding has not progressed from prior study. 6.  Uterus absent. 7.  Small ventral hernia containing only fat. Aortic aneurysm NOS (ICD10-I71.9). Aortic Atherosclerosis (ICD10-I70.0). Electronically Signed   By: Lowella Grip III M.D.   On: 08/01/2017 13:44   Dg Chest Port 1 View  Result Date: 08/02/2017 CLINICAL DATA:  Productive cough. EXAM: PORTABLE CHEST 1 VIEW COMPARISON:  04/02/2016 FINDINGS: 1710 hours. The lungs are clear without focal pneumonia, edema, pneumothorax or pleural effusion. Cardiopericardial silhouette is at upper limits of normal for size. The visualized bony structures of the thorax are intact. The NG tube passes into the stomach although the distal tip position is not included on the film. Telemetry leads overlie the chest. IMPRESSION: No acute cardiopulmonary findings. Electronically Signed   By: Misty Stanley M.D.   On: 08/02/2017 19:29   Dg Abd Portable 1v-small Bowel Obstruction Protocol-initial, 8 Hr Delay  Result Date: 08/02/2017 CLINICAL DATA:  Small  bowel obstruction. EXAM: PORTABLE ABDOMEN - 1 VIEW COMPARISON:  Body CT 08/01/2017 FINDINGS: Enteric catheter in the expected location of gastric body, side hole at the GE junction. Persistent mild dilation of small bowel loops. Normal appearance of the colon. Moderate amount of formed stool in the right colon. Postsurgical changes in the abdomen. IMPRESSION: Enteric catheter and the expected location of gastric body with side hole at the GE junction. Ongoing small bowel obstruction. Electronically Signed   By: Fidela Salisbury M.D.   On: 08/02/2017 03:26   Dg Abd Portable 1v-small Bowel Protocol-position  Verification  Result Date: 08/01/2017 CLINICAL DATA:  Nasogastric tube placement EXAM: PORTABLE ABDOMEN - 1 VIEW COMPARISON:  CT abdomen and pelvis August 01, 2017 FINDINGS: Nasogastric tube tip and side port are in the proximal stomach. Moderate stool is seen in the colon. There are loops of mildly dilated bowel in the upper abdomen. Visualized lung regions are clear. IMPRESSION: Nasogastric tube tip and side port in proximal stomach. Mild bowel dilatation in visualized upper abdomen. Moderate stool in colon. Visualized lungs clear. Electronically Signed   By: Lowella Grip III M.D.   On: 08/01/2017 16:45    Anti-infectives: Anti-infectives (From admission, onward)   None       Assessment/Plan HTN GERD AAA repair  SBO -Small bowel protocol with Gastrografin, 8 hour delay film did not show any contrast, suspect it was sucked out tube; DG abd this AM pending  - clamp NG tube and start clears around tube.  - follow DG abd, likely D/C NG tube today and advance diet to soft as tolerated.    LOS: 2 days    Jill Alexanders , [facility] 08/03/2017, 9:19 AM Pager: [phone] Consults: [phone] Mon-Fri 7:00 am-4:30 pm Sat-Sun 7:00 am-11:30 am

## 2017-08-04 LAB — BASIC METABOLIC PANEL
Anion gap: 7 (ref 5–15)
BUN: 7 mg/dL (ref 6–20)
CHLORIDE: 106 mmol/L (ref 101–111)
CO2: 24 mmol/L (ref 22–32)
CREATININE: 0.89 mg/dL (ref 0.44–1.00)
Calcium: 9.6 mg/dL (ref 8.9–10.3)
GFR calc Af Amer: >60 mL/min (ref >60)
GFR calc non Af Amer: >60 mL/min (ref >60)
GLUCOSE: 106 mg/dL — AB (ref 65–99)
Potassium: 4.1 mmol/L (ref 3.5–5.1)
Sodium: 137 mmol/L (ref 135–145)

## 2017-08-04 LAB — MAGNESIUM: Magnesium: 2 mg/dL (ref 1.7–2.4)

## 2017-08-04 MED ORDER — TRAMADOL HCL 50 MG PO TABS: 50.0000 mg | ORAL_TABLET | Freq: Four times a day (QID) | ORAL | 0 refills | Status: DC | PRN

## 2017-08-04 MED ORDER — POLYETHYLENE GLYCOL 3350 17 G PO PACK: 17.0000 g | PACK | Freq: Every day | ORAL | 0 refills | Status: DC | PRN

## 2017-08-04 MED ORDER — ONDANSETRON HCL 4 MG PO TABS: 4.0000 mg | ORAL_TABLET | Freq: Four times a day (QID) | ORAL | 0 refills | Status: DC | PRN

## 2017-08-04 MED ORDER — SENNA 8.6 MG PO TABS: 1.0000 | ORAL_TABLET | Freq: Two times a day (BID) | ORAL | 0 refills | Status: DC

## 2017-08-04 NOTE — Progress Notes (Signed)
1400 Pt tolerated soft diet, Dr Redmond Pulling from Gen surgery notified. Agreed for discharge today. He commended to cont soft diet and protein shakes, pt advised. Discharge instructions given to pt, verbalized understanding. Discharged to home accompanied by spouse.

## 2017-08-04 NOTE — Progress Notes (Signed)
   Subjective/Chief Complaint: Tolerating liquids Has not had soft diet yet Reports passing flatus and had small bm's Having minimal abdominal pain   Objective: Vital signs in last 24 hours: Temp:  [97.9 F (36.6 C)-101.2 F (38.4 C)] 97.9 F (36.6 C) (12/08 0434) Pulse Rate:  [58-98] 58 (12/08 0434) Resp:  [15-19] 15 (12/08 0434) BP: (143-163)/(82-88) 148/84 (12/08 0434) SpO2:  [99 %-100 %] 100 % (12/08 0434) Last BM Date: 08/02/17  Intake/Output from previous day: 12/07 0701 - 12/08 0700 In: 1250 [P.O.:360; I.V.:840; IV Piggyback:50] Out: -  Intake/Output this shift: Total I/O In: 480 [P.O.:480] Out: 0   Exam: Abdomen soft, non-distended, minimally tender  Lab Results:  Recent Labs    08/02/17 0559  WBC 5.8  HGB 13.8  HCT 41.6  PLT 214   BMET Recent Labs    08/03/17 0529 08/04/17 0425  NA 139 137  K 3.8 4.1  CL 106 106  CO2 26 24  GLUCOSE 121* 106*  BUN 5* 7  CREATININE 0.85 0.89  CALCIUM 9.7 9.6   PT/INR No results for input(s): LABPROT, INR in the last 72 hours. ABG No results for input(s): PHART, HCO3 in the last 72 hours.  Invalid input(s): PCO2, PO2  Studies/Results: Dg Chest Port 1 View  Result Date: 08/02/2017 CLINICAL DATA:  Productive cough. EXAM: PORTABLE CHEST 1 VIEW COMPARISON:  04/02/2016 FINDINGS: 1710 hours. The lungs are clear without focal pneumonia, edema, pneumothorax or pleural effusion. Cardiopericardial silhouette is at upper limits of normal for size. The visualized bony structures of the thorax are intact. The NG tube passes into the stomach although the distal tip position is not included on the film. Telemetry leads overlie the chest. IMPRESSION: No acute cardiopulmonary findings. Electronically Signed   By: Misty Stanley M.D.   On: 08/02/2017 19:29   Dg Abd Portable 1v  Result Date: 08/03/2017 CLINICAL DATA:  Small bowel obstruction, improving nausea and abdominal distension EXAM: PORTABLE ABDOMEN - 1 VIEW COMPARISON:   Portable exam 1036 hours compared to 08/02/2017 FINDINGS: Retained contrast in RIGHT colon. Resolution of small bowel distention since previous exam. Scattered stool and gas in colon to rectum. No bowel dilatation or bowel wall thickening. Scattered surgical clips LEFT abdomen. Nasogastric tube projects over proximal stomach. No acute osseous findings. IMPRESSION: Resolution of small bowel distention seen on previous exam. Electronically Signed   By: Lavonia Dana M.D.   On: 08/03/2017 11:09    Anti-infectives: Anti-infectives (From admission, onward)   None      Assessment/Plan:  Resolving SBO  Try soft diet   LOS: 3 days    Myrta Mercer A 08/04/2017

## 2017-08-04 NOTE — Discharge Summary (Signed)
Physician Discharge Summary  Connie Davis OFB:[id number] DOB: [birth year] DOA: 08/01/2017  PCP: [name], MD  Admit date: 08/01/2017 Discharge date: 08/04/2017  Admitted From: Home Disposition: Home  Recommendations for Outpatient Follow-up:  1. Follow up with PCP in 1week 2. Follow-up with general surgery in 1-2 weeks   Home Health: No Equipment/Devices: None  Discharge Condition: Stable CODE STATUS: Full Diet recommendation: Heart Healthy/diet as per general surgery recommendations  Brief/Interim Summary: 51 year old female with past medical history of hysterectomy, AAA repair and hypertension presented with abdominal pain with vomiting.  She was found to have small bowel obstruction.  General surgery was consulted.  Patient initially required NG tube which was eventually removed.  She is tolerating liquid diet which will be advanced today to soft diet.  She is having bowel movement.  If she tolerates soft diet today and if general surgery clears the patient for discharge, patient will be discharged home.   Discharge Diagnoses:  Active Problems:   SBO (small bowel obstruction) (HCC)   HTN (hypertension)   Hypokalemia   SBO -General surgery following.    Status post NG tube placement and removal. -She is tolerating liquid diet which will be advanced today to soft diet.  She is having bowel movement.  If she tolerates soft diet today and if general surgery clears the patient for discharge, patient will be discharged home. -Outpatient follow-up with general surgery  HTN -Resume oral antihypertensives.  Outpatient follow-up  Hypokalemia -Improved     Discharge Instructions  Discharge Instructions    Call MD for:  difficulty breathing, headache or visual disturbances   Complete by:  As directed    Call MD for:  extreme fatigue   Complete by:  As directed    Call MD for:  hives   Complete by:  As directed    Call MD for:  persistant dizziness or  light-headedness   Complete by:  As directed    Call MD for:  persistant nausea and vomiting   Complete by:  As directed    Call MD for:  severe uncontrolled pain   Complete by:  As directed    Call MD for:  temperature >100.4   Complete by:  As directed    Diet - low sodium heart healthy   Complete by:  As directed    Increase activity slowly   Complete by:  As directed      Allergies as of 08/04/2017   No Known Allergies     Medication List    STOP taking these medications   acetaminophen 500 MG tablet Commonly known as:  TYLENOL   metoCLOPramide 10 MG tablet Commonly known as:  REGLAN     TAKE these medications   amLODipine 5 MG tablet Commonly known as:  NORVASC Take 5 mg by mouth daily. What changed:  Another medication with the same name was removed. Continue taking this medication, and follow the directions you see here.   aspirin EC 81 MG tablet Take 81 mg by mouth every morning.   carvedilol 25 MG tablet Commonly known as:  COREG Take 25 mg by mouth 2 (two) times daily.   hydrochlorothiazide 25 MG tablet Commonly known as:  HYDRODIURIL Take 25 mg by mouth daily.   ondansetron 4 MG tablet Commonly known as:  ZOFRAN Take 1 tablet (4 mg total) by mouth every 6 (six) hours as needed for nausea.   pantoprazole 40 MG tablet Commonly known as:  PROTONIX Take 40 mg by mouth  daily. What changed:  Another medication with the same name was removed. Continue taking this medication, and follow the directions you see here.   polyethylene glycol packet Commonly known as:  MIRALAX / GLYCOLAX Take 17 g by mouth daily as needed for mild constipation.   senna 8.6 MG Tabs tablet Commonly known as:  SENOKOT Take 1 tablet (8.6 mg total) by mouth 2 (two) times daily.   SYSTANE BALANCE OP Place 1 drop into both eyes daily as needed. For dry eyes   traMADol 50 MG tablet Commonly known as:  ULTRAM Take 1 tablet (50 mg total) by mouth every 6 (six) hours as needed for  moderate pain.      Follow-up Information    [name], MD. Schedule an appointment as soon as possible for a visit in 1 week(s).   Specialty:  Internal Medicine Contact information: [address] [place] [address] [phone]        Erroll Luna, MD. Schedule an appointment as soon as possible for a visit in 2 week(s).   Specialty:  General Surgery Why:  follow up in 1-2 weeks Contact information: [place] [address] [phone]          No Known Allergies  Consultations:  General surgery   Procedures/Studies: Ct Abdomen Pelvis W Contrast  Result Date: 08/01/2017 CLINICAL DATA:  Abdominal pain with nausea and vomiting EXAM: CT ABDOMEN AND PELVIS WITH CONTRAST TECHNIQUE: Multidetector CT imaging of the abdomen and pelvis was performed using the standard protocol following bolus administration of intravenous contrast. CONTRAST:  122mL ISOVUE-300 IOPAMIDOL (ISOVUE-300) INJECTION 61% COMPARISON:  April 02, 2016 and February 17, 2016 FINDINGS: Lower chest: Lung bases are clear. Hepatobiliary: No focal liver lesions are evident. Gallbladder wall does thickened. There is no biliary duct dilatation. Pancreas: No pancreatic mass or inflammatory focus. Spleen: No splenic lesions are evident. Adrenals/Urinary Tract: Adrenals appear unremarkable bilaterally. There is evidence of chronic decreased attenuation in the lateral aspect of the right kidney with scarring and localized right lateral calyseal dilatation, similar in appearance to prior studies. Question prior infarct in this area versus dysplasia. There is no well-defined renal mass on either side. There is mild chronic fullness of the left renal collecting system. There is no similar fullness of the right renal collecting system. There is no renal or ureteral calculus on either side. Urinary bladder is midline with wall thickness within normal limits. Stomach/Bowel: There is dilatation of  multiple loops of small bowel with a transition zone noted in the proximal ileum region consistent with a degree of small bowel obstruction. Several loops of jejunum show mild wall thickening. There is no free air or portal venous air. There are scattered colonic diverticula without diverticulitis. Vascular/Lymphatic: The patient has had a previous aorto bi-iliac bypass graft. In comparison with the previous study, there is considerably less fluid surrounding the iliac arteries. There remains dilatation of the aorta at the level of the renal arteries with a maximum transverse diameter of 3.8 x 3.7 cm. There remains dilatation of the aorta slightly more distally measuring 3.5 x 3.5 cm. There is periaortic fluid as well as areas of calcification. Mild fluid surrounds the proximal common iliac arteries, much less than on prior study. There is no contrast extravasation. The major mesenteric arteries appear patent, although there is narrowing of the proximal right renal artery. No adenopathy is evident in the abdomen or pelvis. Reproductive: Uterus is absent. There is no pelvic mass. There is  moderate free fluid in the dependent portion of the pelvis. Other: There is no periappendiceal region inflammation. No abscess is evident in the abdomen or pelvis. There is a small ventral hernia containing only fat. No ascites is seen outside of the pelvis. Musculoskeletal: There are no blastic or lytic bone lesions. There is no intramuscular or abdominal wall lesions evident. IMPRESSION: 1. Evidence of a degree of small bowel obstruction with transition zone in the proximal ileum region. No free air. 2 status post aorta bi-iliac repair. There is much less fluid surrounding the proximal iliac arteries compared to the previous study. Apparent retroperitoneal hematoma seen previously has resolved. There remains aneurysmal dilatation in the mid the distal aorta with a mean maximum transverse diameter at the level of the renal arteries  measuring 3.8 x 3.7 cm. Recommend followup by ultrasound in 2 years. This recommendation follows ACR consensus guidelines: White Paper of the ACR Incidental Findings Committee II on Vascular Findings. J Am Coll Radiol 2013; 10:789-794. There is no periaortic fluid. There is atherosclerotic change in the aorta. 3.  Moderate ascites in the pelvis.  No ascites elsewhere. 4. Question prior infarct with scarring in the lateral right kidney. There may be a degree of dysplasia in this area. This appearance is essentially stable compared to previous studies. 5. No renal or ureteral calculus. Mild fullness of the left renal collecting system is chronic and potentially may be due to previous scarring involving the left ureter due to the previous aneurysm. This finding has not progressed from prior study. 6.  Uterus absent. 7.  Small ventral hernia containing only fat. Aortic aneurysm NOS (ICD10-I71.9). Aortic Atherosclerosis (ICD10-I70.0). Electronically Signed   By: Lowella Grip III M.D.   On: 08/01/2017 13:44   Dg Chest Port 1 View  Result Date: 08/02/2017 CLINICAL DATA:  Productive cough. EXAM: PORTABLE CHEST 1 VIEW COMPARISON:  04/02/2016 FINDINGS: 1710 hours. The lungs are clear without focal pneumonia, edema, pneumothorax or pleural effusion. Cardiopericardial silhouette is at upper limits of normal for size. The visualized bony structures of the thorax are intact. The NG tube passes into the stomach although the distal tip position is not included on the film. Telemetry leads overlie the chest. IMPRESSION: No acute cardiopulmonary findings. Electronically Signed   By: Misty Stanley M.D.   On: 08/02/2017 19:29   Dg Abd Portable 1v  Result Date: 08/03/2017 CLINICAL DATA:  Small bowel obstruction, improving nausea and abdominal distension EXAM: PORTABLE ABDOMEN - 1 VIEW COMPARISON:  Portable exam 1036 hours compared to 08/02/2017 FINDINGS: Retained contrast in RIGHT colon. Resolution of small bowel distention  since previous exam. Scattered stool and gas in colon to rectum. No bowel dilatation or bowel wall thickening. Scattered surgical clips LEFT abdomen. Nasogastric tube projects over proximal stomach. No acute osseous findings. IMPRESSION: Resolution of small bowel distention seen on previous exam. Electronically Signed   By: Lavonia Dana M.D.   On: 08/03/2017 11:09   Dg Abd Portable 1v-small Bowel Obstruction Protocol-initial, 8 Hr Delay  Result Date: 08/02/2017 CLINICAL DATA:  Small bowel obstruction. EXAM: PORTABLE ABDOMEN - 1 VIEW COMPARISON:  Body CT 08/01/2017 FINDINGS: Enteric catheter in the expected location of gastric body, side hole at the GE junction. Persistent mild dilation of small bowel loops. Normal appearance of the colon. Moderate amount of formed stool in the right colon. Postsurgical changes in the abdomen. IMPRESSION: Enteric catheter and the expected location of gastric body with side hole at the GE junction. Ongoing small bowel obstruction. Electronically  Signed   By: Fidela Salisbury M.D.   On: 08/02/2017 03:26   Dg Abd Portable 1v-small Bowel Protocol-position Verification  Result Date: 08/01/2017 CLINICAL DATA:  Nasogastric tube placement EXAM: PORTABLE ABDOMEN - 1 VIEW COMPARISON:  CT abdomen and pelvis August 01, 2017 FINDINGS: Nasogastric tube tip and side port are in the proximal stomach. Moderate stool is seen in the colon. There are loops of mildly dilated bowel in the upper abdomen. Visualized lung regions are clear. IMPRESSION: Nasogastric tube tip and side port in proximal stomach. Mild bowel dilatation in visualized upper abdomen. Moderate stool in colon. Visualized lungs clear. Electronically Signed   By: Lowella Grip III M.D.   On: 08/01/2017 16:45      Subjective: Patient seen and examined at bedside.  She denies overnight fever or vomiting.  She had a bowel movement.  She is tolerating liquid diet.  Discharge Exam: Vitals:   08/03/17 2042 08/04/17 0434   BP: (!) 143/82 (!) 148/84  Pulse: 70 (!) 58  Resp: 15 15  Temp: 99.3 F (37.4 C) 97.9 F (36.6 C)  SpO2: 100% 100%   Vitals:   08/03/17 0450 08/03/17 1312 08/03/17 2042 08/04/17 0434  BP: (!) 168/91 (!) 163/88 (!) 143/82 (!) 148/84  Pulse: 88 98 70 (!) 58  Resp: 18 19 15 15   Temp: 99.7 F (37.6 C) (!) 101.2 F (38.4 C) 99.3 F (37.4 C) 97.9 F (36.6 C)  TempSrc: Oral Oral Oral Oral  SpO2: 98% 99% 100% 100%  Weight:      Height:        General: Pt is alert, awake, not in acute distress Cardiovascular: Mild intermittent bradycardia, S1/S2 + Respiratory: Bilateral decreased breath sounds at bases  abdominal: Soft, NT, ND, bowel sounds + Extremities: no edema, no cyanosis    The results of significant diagnostics from this hospitalization (including imaging, microbiology, ancillary and laboratory) are listed below for reference.     Microbiology: No results found for this or any previous visit (from the past 240 hour(s)).   Labs: BNP (last 3 results) No results for input(s): BNP in the last 8760 hours. Basic Metabolic Panel: Recent Labs  Lab 08/01/17 0826 08/02/17 0559 08/03/17 0529 08/04/17 0425  NA 137 138 139 137  K 3.1* 3.5 3.8 4.1  CL 95* 103 106 106  CO2 29 27 26 24   GLUCOSE 96 125* 121* 106*  BUN 10 6 5* 7  CREATININE 0.91 0.78 0.85 0.89  CALCIUM 10.0 9.3 9.7 9.6  MG  --   --  2.1 2.0   Liver Function Tests: Recent Labs  Lab 08/01/17 0826  AST 19  ALT 15  ALKPHOS 108  BILITOT 0.5  PROT 8.6*  ALBUMIN 3.7   Recent Labs  Lab 08/01/17 0826  LIPASE 27   No results for input(s): AMMONIA in the last 168 hours. CBC: Recent Labs  Lab 08/01/17 0826 08/02/17 0559  WBC 6.4 5.8  HGB 13.8 13.8  HCT 40.8 41.6  MCV 76.1* 76.2*  PLT 213 214   Cardiac Enzymes: No results for input(s): CKTOTAL, CKMB, CKMBINDEX, TROPONINI in the last 168 hours. BNP: Invalid input(s): POCBNP CBG: No results for input(s): GLUCAP in the last 168  hours. D-Dimer No results for input(s): DDIMER in the last 72 hours. Hgb A1c No results for input(s): HGBA1C in the last 72 hours. Lipid Profile No results for input(s): CHOL, HDL, LDLCALC, TRIG, CHOLHDL, LDLDIRECT in the last 72 hours. Thyroid function studies No results for  input(s): TSH, T4TOTAL, T3FREE, THYROIDAB in the last 72 hours.  Invalid input(s): FREET3 Anemia work up No results for input(s): VITAMINB12, FOLATE, FERRITIN, TIBC, IRON, RETICCTPCT in the last 72 hours. Urinalysis    Component Value Date/Time   COLORURINE YELLOW 08/01/2017 1200   APPEARANCEUR CLEAR 08/01/2017 1200   LABSPEC 1.019 08/01/2017 1200   PHURINE 7.0 08/01/2017 1200   GLUCOSEU NEGATIVE 08/01/2017 1200   HGBUR NEGATIVE 08/01/2017 1200   BILIRUBINUR NEGATIVE 08/01/2017 1200   KETONESUR 5 (A) 08/01/2017 1200   PROTEINUR NEGATIVE 08/01/2017 1200   NITRITE NEGATIVE 08/01/2017 1200   LEUKOCYTESUR NEGATIVE 08/01/2017 1200   Sepsis Labs Invalid input(s): PROCALCITONIN,  WBC,  LACTICIDVEN Microbiology No results found for this or any previous visit (from the past 240 hour(s)).   Time coordinating discharge:35 minutes  SIGNED:   Aline August, MD  [facility] 08/04/2017, 9:42 AM Pager: [phone]  If 7PM-7AM, please contact night-coverage [email or web address] Password [email or web address]

## 2017-08-04 NOTE — Discharge Instructions (Signed)
Soft-Food Meal Plan °A soft-food meal plan includes foods that are safe and easy to swallow. This meal plan typically is used: °· If you are having trouble chewing or swallowing foods. °· As a transition meal plan after only having had liquid meals for a long period. ° °What do I need to know about the soft-food meal plan? °A soft-food meal plan includes tender foods that are soft and easy to chew and swallow. In most cases, bite-sized pieces of food are easier to swallow. A bite-sized piece is about ½ inch or smaller. Foods in this plan do not need to be ground or pureed. °Foods that are very hard, crunchy, or sticky should be avoided. Also, breads, cereals, yogurts, and desserts with nuts, seeds, or fruits should be avoided. °What foods can I eat? °Grains °Rice and wild rice. Moist bread, dressing, pasta, and noodles. Well-moistened dry or cooked cereals, such as farina (cooked wheat cereal), oatmeal, or grits. Biscuits, breads, muffins, pancakes, and waffles that have been well moistened. °Vegetables °Shredded lettuce. Cooked, tender vegetables, including potatoes without skins. Vegetable juices. Broths or creamed soups made with vegetables that are not stringy or chewy. Strained tomatoes (without seeds). °Fruits °Canned or well-cooked fruits. Soft (ripe), peeled fresh fruits, such as peaches, nectarines, kiwi, cantaloupe, honeydew melon, and watermelon (without seeds). Soft berries with small seeds, such as strawberries. Fruit juices (without pulp). °Meats and Other Protein Sources °Moist, tender, lean beef. Mutton. Lamb. Veal. Chicken. Turkey. Liver. Ham. Fish without bones. Eggs. °Dairy °Milk, milk drinks, and cream. Plain cream cheese and cottage cheese. Plain yogurt. °Sweets/Desserts °Flavored gelatin desserts. Custard. Plain ice cream, frozen yogurt, sherbet, milk shakes, and malts. Plain cakes and cookies. Plain hard candy. °Other °Butter, margarine (without trans fat), and cooking oils. Mayonnaise. Cream  sauces. Mild spices, salt, and sugar. Syrup, molasses, honey, and jelly. °The items listed above may not be a complete list of recommended foods or beverages. Contact your dietitian for more options. °What foods are not recommended? °Grains °Dry bread, toast, crackers that have not been moistened. Coarse or dry cereals, such as bran, granola, and shredded wheat. Tough or chewy crusty breads, such as French bread or baguettes. °Vegetables °Corn. Raw vegetables except shredded lettuce. Cooked vegetables that are tough or stringy. Tough, crisp, fried potatoes and potato skins. °Fruits °Fresh fruits with skins or seeds or both, such as apples, pears, or grapes. Stringy, high-pulp fruits, such as papaya, pineapple, coconut, or mango. Fruit leather, fruit roll-ups, and all dried fruits. °Meats and Other Protein Sources °Sausages and hot dogs. Meats with gristle. Fish with bones. Nuts, seeds, and chunky peanut or other nut butters. °Sweets/Desserts °Cakes or cookies that are very dry or chewy. °The items listed above may not be a complete list of foods and beverages to avoid. Contact your dietitian for more information. °This information is not intended to replace advice given to you by your health care provider. Make sure you discuss any questions you have with your health care provider. °Document Released: 11/21/2007 Document Revised: 01/20/2016 Document Reviewed: 07/11/2013 °Elsevier Interactive Patient Education © 2017 [employer] ° °

## 2017-08-28 HISTORY — PX: GANGLION CYST EXCISION: SHX1691

## 2018-05-09 DIAGNOSIS — M79642 Pain in left hand: Secondary | ICD-10-CM | POA: Insufficient documentation

## 2018-06-01 ENCOUNTER — Observation Stay (HOSPITAL_COMMUNITY)
Admission: EM | Admit: 2018-06-01 | Discharge: 2018-06-02 | Disposition: A | Discharge status: Home / Self Care | Payer: BLUE CROSS/BLUE SHIELD | Attending: Internal Medicine | Admitting: Internal Medicine

## 2018-06-01 ENCOUNTER — Encounter (HOSPITAL_COMMUNITY): Payer: Self-pay | Admitting: Emergency Medicine

## 2018-06-01 ENCOUNTER — Other Ambulatory Visit: Payer: Self-pay

## 2018-06-01 ENCOUNTER — Encounter (HOSPITAL_COMMUNITY): Payer: Self-pay

## 2018-06-01 ENCOUNTER — Emergency Department (HOSPITAL_COMMUNITY): Payer: BLUE CROSS/BLUE SHIELD

## 2018-06-01 ENCOUNTER — Emergency Department (HOSPITAL_COMMUNITY)
Admission: EM | Admit: 2018-06-01 | Discharge: 2018-06-01 | Disposition: A | Discharge status: Home / Self Care | Payer: BLUE CROSS/BLUE SHIELD | Source: Home / Self Care | Attending: Emergency Medicine | Admitting: Emergency Medicine

## 2018-06-01 DIAGNOSIS — M79642 Pain in left hand: Secondary | ICD-10-CM

## 2018-06-01 DIAGNOSIS — T7840XD Allergy, unspecified, subsequent encounter: Secondary | ICD-10-CM

## 2018-06-01 DIAGNOSIS — M79641 Pain in right hand: Secondary | ICD-10-CM

## 2018-06-01 DIAGNOSIS — E876 Hypokalemia: Secondary | ICD-10-CM | POA: Diagnosis present

## 2018-06-01 DIAGNOSIS — I10 Essential (primary) hypertension: Secondary | ICD-10-CM

## 2018-06-01 DIAGNOSIS — L299 Pruritus, unspecified: Secondary | ICD-10-CM

## 2018-06-01 DIAGNOSIS — Z7982 Long term (current) use of aspirin: Secondary | ICD-10-CM | POA: Insufficient documentation

## 2018-06-01 DIAGNOSIS — M79672 Pain in left foot: Secondary | ICD-10-CM | POA: Insufficient documentation

## 2018-06-01 DIAGNOSIS — R220 Localized swelling, mass and lump, head: Secondary | ICD-10-CM | POA: Diagnosis not present

## 2018-06-01 DIAGNOSIS — X58XXXA Exposure to other specified factors, initial encounter: Secondary | ICD-10-CM | POA: Insufficient documentation

## 2018-06-01 DIAGNOSIS — M79671 Pain in right foot: Secondary | ICD-10-CM

## 2018-06-01 DIAGNOSIS — Z90722 Acquired absence of ovaries, bilateral: Secondary | ICD-10-CM | POA: Insufficient documentation

## 2018-06-01 DIAGNOSIS — Z79899 Other long term (current) drug therapy: Secondary | ICD-10-CM | POA: Insufficient documentation

## 2018-06-01 DIAGNOSIS — Z8719 Personal history of other diseases of the digestive system: Secondary | ICD-10-CM | POA: Insufficient documentation

## 2018-06-01 DIAGNOSIS — T783XXA Angioneurotic edema, initial encounter: Principal | ICD-10-CM | POA: Diagnosis present

## 2018-06-01 DIAGNOSIS — Z90710 Acquired absence of both cervix and uterus: Secondary | ICD-10-CM | POA: Insufficient documentation

## 2018-06-01 DIAGNOSIS — Z9889 Other specified postprocedural states: Secondary | ICD-10-CM | POA: Diagnosis not present

## 2018-06-01 DIAGNOSIS — Z8679 Personal history of other diseases of the circulatory system: Secondary | ICD-10-CM | POA: Insufficient documentation

## 2018-06-01 DIAGNOSIS — M7989 Other specified soft tissue disorders: Secondary | ICD-10-CM | POA: Insufficient documentation

## 2018-06-01 DIAGNOSIS — K219 Gastro-esophageal reflux disease without esophagitis: Secondary | ICD-10-CM | POA: Diagnosis present

## 2018-06-01 LAB — CBC WITH DIFFERENTIAL/PLATELET
ABS IMMATURE GRANULOCYTES: 0 10*3/uL (ref 0.0–0.1)
ABS IMMATURE GRANULOCYTES: 0 10*3/uL (ref 0.0–0.1)
BASOS PCT: 0 %
BASOS PCT: 0 %
Basophils Absolute: 0 10*3/uL (ref 0.0–0.1)
Basophils Absolute: 0 10*3/uL (ref 0.0–0.1)
EOS ABS: 0.1 10*3/uL (ref 0.0–0.7)
Eosinophils Absolute: 0.1 10*3/uL (ref 0.0–0.7)
Eosinophils Relative: 1 %
Eosinophils Relative: 1 %
HCT: 41.1 % (ref 36.0–46.0)
HCT: 39.9 % (ref 36.0–46.0)
HEMOGLOBIN: 13.4 g/dL (ref 12.0–15.0)
Hemoglobin: 13.1 g/dL (ref 12.0–15.0)
IMMATURE GRANULOCYTES: 0 %
Immature Granulocytes: 0 %
LYMPHS PCT: 39 %
Lymphocytes Relative: 45 %
Lymphs Abs: 2.2 10*3/uL (ref 0.7–4.0)
Lymphs Abs: 2.9 10*3/uL (ref 0.7–4.0)
MCH: 25.6 pg — ABNORMAL LOW (ref 26.0–34.0)
MCH: 25.9 pg — AB (ref 26.0–34.0)
MCHC: 32.6 g/dL (ref 30.0–36.0)
MCHC: 32.8 g/dL (ref 30.0–36.0)
MCV: 78.6 fL (ref 78.0–100.0)
MCV: 78.9 fL (ref 78.0–100.0)
MONO ABS: 0.5 10*3/uL (ref 0.1–1.0)
MONOS PCT: 8 %
Monocytes Absolute: 0.4 10*3/uL (ref 0.1–1.0)
Monocytes Relative: 7 %
NEUTROS ABS: 2.9 10*3/uL (ref 1.7–7.7)
NEUTROS PCT: 53 %
Neutro Abs: 2.9 10*3/uL (ref 1.7–7.7)
Neutrophils Relative %: 46 %
PLATELETS: 196 10*3/uL (ref 150–400)
Platelets: 196 10*3/uL (ref 150–400)
RBC: 5.23 MIL/uL — AB (ref 3.87–5.11)
RBC: 5.06 MIL/uL (ref 3.87–5.11)
RDW: 15 % (ref 11.5–15.5)
RDW: 14.9 % (ref 11.5–15.5)
WBC: 5.5 10*3/uL (ref 4.0–10.5)
WBC: 6.4 10*3/uL (ref 4.0–10.5)

## 2018-06-01 LAB — COMPREHENSIVE METABOLIC PANEL
ALBUMIN: 3.6 g/dL (ref 3.5–5.0)
ALT: 14 U/L (ref 0–44)
ANION GAP: 8 (ref 5–15)
AST: 18 U/L (ref 15–41)
Alkaline Phosphatase: 80 U/L (ref 38–126)
BILIRUBIN TOTAL: 0.5 mg/dL (ref 0.3–1.2)
BUN: 9 mg/dL (ref 6–20)
CO2: 27 mmol/L (ref 22–32)
Calcium: 9.5 mg/dL (ref 8.9–10.3)
Chloride: 102 mmol/L (ref 98–111)
Creatinine, Ser: 0.79 mg/dL (ref 0.44–1.00)
GFR calc non Af Amer: >60 mL/min (ref >60)
GLUCOSE: 97 mg/dL (ref 70–99)
POTASSIUM: 2.9 mmol/L — AB (ref 3.5–5.1)
SODIUM: 137 mmol/L (ref 135–145)
Total Protein: 7.3 g/dL (ref 6.5–8.1)

## 2018-06-01 LAB — I-STAT BETA HCG BLOOD, ED (MC, WL, AP ONLY): I-stat hCG, quantitative: <5 m[IU]/mL (ref <5)

## 2018-06-01 MED ORDER — EPINEPHRINE 0.3 MG/0.3ML IJ SOAJ
0.3000 mg | Freq: Once | INTRAMUSCULAR | Status: AC
  Administered 2018-06-01: 0.3 mg via INTRAMUSCULAR

## 2018-06-01 MED ORDER — POTASSIUM CHLORIDE 20 MEQ/15ML (10%) PO SOLN
60.0000 meq | Freq: Once | ORAL | Status: AC
  Administered 2018-06-02: 60 meq via ORAL
  Filled 2018-06-01: qty 45

## 2018-06-01 MED ORDER — POTASSIUM CHLORIDE 10 MEQ/100ML IV SOLN
10.0000 meq | INTRAVENOUS | Status: AC
  Administered 2018-06-02 (×2): 10 meq via INTRAVENOUS
  Filled 2018-06-01 (×2): qty 100

## 2018-06-01 MED ORDER — HYDROXYZINE HCL 25 MG PO TABS: 25.0000 mg | ORAL_TABLET | Freq: Four times a day (QID) | ORAL | 0 refills | Status: DC | PRN

## 2018-06-01 MED ORDER — FAMOTIDINE 20 MG PO TABS
20.0000 mg | ORAL_TABLET | Freq: Once | ORAL | Status: AC
  Administered 2018-06-01: 20 mg via ORAL
  Filled 2018-06-01: qty 1

## 2018-06-01 MED ORDER — EPINEPHRINE 0.3 MG/0.3ML IJ SOAJ: 0.3000 mg | Freq: Once | INTRAMUSCULAR | 0 refills | Status: AC

## 2018-06-01 MED ORDER — PREDNISONE 20 MG PO TABS
60.0000 mg | ORAL_TABLET | Freq: Once | ORAL | Status: AC
  Administered 2018-06-01: 60 mg via ORAL
  Filled 2018-06-01: qty 3

## 2018-06-01 MED ORDER — TRAMADOL HCL 50 MG PO TABS
50.0000 mg | ORAL_TABLET | Freq: Four times a day (QID) | ORAL | 0 refills | Status: DC | PRN
Start: 2018-06-01 — End: 2018-07-10

## 2018-06-01 NOTE — ED Triage Notes (Signed)
Patient complains of 2 days of lip feeling funny and today right hand itching with swelling and bilateral foot swelling. Took benadryl with no change. No distress, no known exposure

## 2018-06-01 NOTE — ED Provider Notes (Signed)
Emergency Department Provider Note   I have reviewed the triage vital signs and the nursing notes.   HISTORY  Chief Complaint No chief complaint on file.   HPI Connie Davis is a 52 y.o. female presents to the ED with bilateral hand/foot pain, swelling, and itching. Patient reports upper and lower lip swelling and itching yesterday that has mostly resolved. No SOB, CP, or throat tightness. No exposure to new meds or other known allergins. No abdominal pain, nausea, vomiting, or diarrhea. Patient has tried benadryl with no relief.   Past Medical History:  Diagnosis Date  . Abdominal aneurysm ([place])   . Blood transfusion without reported diagnosis   . GERD (gastroesophageal reflux disease)   . Hypertension   . SBO (small bowel obstruction) ([place]) 07/2017    Patient Active Problem List   Diagnosis Date Noted  . SBO (small bowel obstruction) ([place]) 08/01/2017  . HTN (hypertension) 08/01/2017  . Hypokalemia 08/01/2017  . Postoperative state 01/01/2014  . S/P hysterectomy- [facility] 01/01/2014    Past Surgical History:  Procedure Laterality Date  . ABDOMINAL AORTIC ANEURYSM REPAIR  2017  . ABDOMINAL HYSTERECTOMY    . BILATERAL SALPINGECTOMY Bilateral 01/01/2014   Procedure: BILATERAL SALPINGECTOMY;  Surgeon: Princess Bruins, MD;  Location: [facility];  Service: Gynecology;  Laterality: Bilateral;  . NO PAST SURGERIES    . ROBOTIC ASSISTED TOTAL HYSTERECTOMY N/A 01/01/2014   Procedure: ROBOTIC ASSISTED TOTAL HYSTERECTOMY;  Surgeon: Princess Bruins, MD;  Location: [facility];  Service: Gynecology;  Laterality: N/A;    Allergies Patient has no known allergies.  Family History  Problem Relation Age of Onset  . Colon cancer Neg Hx     Social History Social History   Tobacco Use  . Smoking status: Never Smoker  . Smokeless tobacco: Never Used  Substance Use Topics  . Alcohol use: No  . Drug use: No    Review of Systems  Constitutional: No fever/chills Eyes: No visual  changes. ENT: No sore throat. Cardiovascular: Denies chest pain. Respiratory: Denies shortness of breath. Gastrointestinal: No abdominal pain.  No nausea, no vomiting.  No diarrhea.  No constipation. Genitourinary: Negative for dysuria. Musculoskeletal: Negative for back pain. Skin: Pain in hands/feet with itching.  Neurological: Negative for headaches, focal weakness or numbness.  10-point ROS otherwise negative.  ____________________________________________   PHYSICAL EXAM:  VITAL SIGNS: ED Triage Vitals  Enc Vitals Group     BP 06/01/18 1342 (!) 162/102     Pulse Rate 06/01/18 1342 74     Resp 06/01/18 1342 18     Temp 06/01/18 1342 98.7 F (37.1 C)     Temp Source 06/01/18 1342 Oral     SpO2 06/01/18 1342 100 %     Pain Score 06/01/18 1344 7   Constitutional: Alert and oriented. Well appearing and in no acute distress. Eyes: Conjunctivae are normal.  Head: Atraumatic. Nose: No congestion/rhinnorhea. Mouth/Throat: Mucous membranes are moist.  Oropharynx non-erythematous. No edema in the oropharynx.  Neck: No stridor.  Cardiovascular: Normal rate, regular rhythm. Good peripheral circulation. Grossly normal heart sounds.   Respiratory: Normal respiratory effort.  No retractions. Lungs CTAB. Gastrointestinal: Soft and nontender. No distention.  Musculoskeletal: No lower extremity tenderness nor edema. No gross deformities of extremities. Neurologic:  Normal speech and language. No gross focal neurologic deficits are appreciated.  Skin:  Skin is warm, dry and intact. Mild foot/hand edema with mild erythema. No lip swelling appreciated.   ____________________________________________   LABS (all labs ordered are  listed, but only abnormal results are displayed)  Labs Reviewed  COMPREHENSIVE METABOLIC PANEL - Abnormal; Notable for the following components:      Result Value   Potassium 2.9 (*)    All other components within normal limits  CBC WITH DIFFERENTIAL/PLATELET  - Abnormal; Notable for the following components:   RBC 5.23 (*)    MCH 25.6 (*)    All other components within normal limits    ____________________________________________   PROCEDURES  Procedure(s) performed:   Procedures  None ____________________________________________   INITIAL IMPRESSION / ASSESSMENT AND PLAN / ED COURSE  Pertinent labs & imaging results that were available during my care of the patient were reviewed by me and considered in my medical decision making (see chart for details).  Patient with hand/feet swelling and itching that seems most consistent with allergy. No anaphylaxis concerns but patient did have lip swelling yesterday. Patient reports a past reaction to prednisone (hand bruising) and so does not want to take this. Offered Atarax for itching and will prescribe Epipen, Tramadol for pain, and advised OTC allergy medication. Patient has PCP appointment on Wednesday. No other medical etiology for itching discovered on labs.   At this time, I do not feel there is any life-threatening condition present. I have reviewed and discussed all results (EKG, imaging, lab, urine as appropriate), exam findings with patient. I have reviewed nursing notes and appropriate previous records.  I feel the patient is safe to be discharged home without further emergent workup. Discussed usual and customary return precautions. Patient and family (if present) verbalize understanding and are comfortable with this plan.  Patient will follow-up with their primary care provider. If they do not have a primary care provider, information for follow-up has been provided to them. All questions have been answered.  ____________________________________________  FINAL CLINICAL IMPRESSION(S) / ED DIAGNOSES  Final diagnoses:  Bilateral hand swelling     NEW OUTPATIENT MEDICATIONS STARTED DURING THIS VISIT:  Discharge Medication List as of 06/01/2018  3:49 PM    START taking these  medications   Details  EPINEPHrine (EPIPEN 2-PAK) 0.3 mg/0.3 mL IJ SOAJ injection Inject 0.3 mLs (0.3 mg total) into the muscle once for 1 dose., Starting Sat 06/01/2018, Normal    hydrOXYzine (ATARAX/VISTARIL) 25 MG tablet Take 1 tablet (25 mg total) by mouth every 6 (six) hours as needed for itching., Starting Sat 06/01/2018, Normal        Note:  This document was prepared using Dragon voice recognition software and may include unintentional dictation errors.  Nanda Quinton, MD Emergency Medicine    Long, Wonda Olds, MD 06/01/18 [phone]

## 2018-06-01 NOTE — Discharge Instructions (Signed)
You were seen in the ED today with hand and foot swelling. Use the medication provided and follow up with your PCP.

## 2018-06-01 NOTE — ED Triage Notes (Signed)
Patient here earlier today for itching and swelling of her foot and hand.  Now with tongue swelling.  No shortness of breath, but she is having some difficulty swallowing.  She took Hydroxyzine.

## 2018-06-01 NOTE — H&P (Signed)
History and Physical    [name] IZT:[id number] DOB: 1966-03-12 DOA: 06/01/2018  Referring MD/NP/PA:   PCP: [name], MD   Patient coming from:  The patient is coming from home.  At baseline, pt is independent for most of ADL.   Chief Complaint: Tongue swelling, bilateral hand/food swelling  HPI: Connie Davis is a 52 y.o. female with medical history significant of AAA s/p repair in 2017, GERD, SBO, and HTN, who presents with tongue swelling, bilateral hand/foot swelling.  Pt states that she has been having intermittent bilateral hand and foot swelling and itching in the past 3 days. She developed mild lip swelling yesterday.  No shortness of breath.  Patient was seen in ED in afternoon. She was treated with EpiPen, and discharged home with hydroxyzine.  Patient states that she developed tongue swelling after she went home.  She feels tightening in throat and difficulty swallowing, but no shortness of breath or stridor. No cough, fever or chills.  Denies new medications, food or detergent use.  No known allergy.  Patient does not have chest pain, nausea, vomiting, diarrhea or abdominal pain.  No symptoms of UTI.  ED Course: pt was found to have WBC 6.4, negative pregnancy test, potassium 2.9, creatinine normal, temperature normal, no tachycardia, oxygen saturation 99% on room air, chest x-ray negative.  Patient is placed on telemetry bed for observation.  Review of Systems:   General: no fevers, chills, no body weight gain, fatigue. Has lip and tongue swelling. HEENT: no blurry vision, hearing changes or sore throat Respiratory: no dyspnea, coughing, wheezing CV: no chest pain, no palpitations GI: no nausea, vomiting, abdominal pain, diarrhea, constipation GU: no dysuria, burning on urination, increased urinary frequency, hematuria  Ext: has mild foot/hand swelling. Neuro: no unilateral weakness, numbness, or tingling, no vision change or hearing loss Skin: no rash, no skin  tear. MSK: No muscle spasm, no deformity, no limitation of range of movement in spin Heme: No easy bruising.  Travel history: No recent long distant travel.  Allergy: No Known Allergies  Past Medical History:  Diagnosis Date  . Abdominal aneurysm ([place])   . Blood transfusion without reported diagnosis   . GERD (gastroesophageal reflux disease)   . Hypertension   . SBO (small bowel obstruction) ([place]) 07/2017    Past Surgical History:  Procedure Laterality Date  . ABDOMINAL AORTIC ANEURYSM REPAIR  2017  . ABDOMINAL HYSTERECTOMY    . BILATERAL SALPINGECTOMY Bilateral 01/01/2014   Procedure: BILATERAL SALPINGECTOMY;  Surgeon: Princess Bruins, MD;  Location: [facility];  Service: Gynecology;  Laterality: Bilateral;  . NO PAST SURGERIES    . ROBOTIC ASSISTED TOTAL HYSTERECTOMY N/A 01/01/2014   Procedure: ROBOTIC ASSISTED TOTAL HYSTERECTOMY;  Surgeon: Princess Bruins, MD;  Location: [facility];  Service: Gynecology;  Laterality: N/A;    Social History:  reports that she has never smoked. She has never used smokeless tobacco. She reports that she does not drink alcohol or use drugs.  Family History:  Family History  Problem Relation Age of Onset  . Colon cancer Neg Hx      Prior to Admission medications   Medication Sig Start Date End Date Taking? Authorizing Provider  amLODipine (NORVASC) 5 MG tablet Take 5 mg by mouth daily.   Yes [provider]  aspirin EC 81 MG tablet Take 81 mg by mouth daily.    Yes [provider]  carvedilol (COREG) 25 MG tablet Take 25 mg by mouth 2 (two) times daily. 03/01/16  Yes  [provider]  EPINEPHrine (EPIPEN 2-PAK) 0.3 mg/0.3 mL IJ SOAJ injection Inject 0.3 mLs (0.3 mg total) into the muscle once for 1 dose. Patient taking differently: Inject 0.3 mg into the muscle as needed (for allergic reaction).  06/01/18 06/01/18 Yes Long, Wonda Olds, MD  hydrochlorothiazide (HYDRODIURIL) 25 MG tablet Take 25 mg by mouth daily.   Yes [provider]  hydrOXYzine (ATARAX/VISTARIL) 25 MG tablet Take 1 tablet (25 mg total) by mouth every 6 (six) hours as needed for itching. 06/01/18  Yes Long, Wonda Olds, MD  pantoprazole (PROTONIX) 40 MG tablet Take 40 mg by mouth daily.   Yes [provider]  ondansetron (ZOFRAN) 4 MG tablet Take 1 tablet (4 mg total) by mouth every 6 (six) hours as needed for nausea. Patient not taking: Reported on 06/01/2018 08/04/17   Aline August, MD  polyethylene glycol (MIRALAX / GLYCOLAX) packet Take 17 g by mouth daily as needed for mild constipation. Patient not taking: Reported on 06/01/2018 08/04/17   Aline August, MD  senna (SENOKOT) 8.6 MG TABS tablet Take 1 tablet (8.6 mg total) by mouth 2 (two) times daily. Patient not taking: Reported on 06/01/2018 08/04/17   Aline August, MD  traMADol (ULTRAM) 50 MG tablet Take 1 tablet (50 mg total) by mouth every 6 (six) hours as needed. 06/01/18   Margette Fast, MD    Physical Exam: Vitals:   06/01/18 2245 06/01/18 2300 06/01/18 2315 06/01/18 2330  BP: (!) 162/103 (!) 145/96 (!) 146/100 (!) 154/91  Pulse: 82 68 70 69  Resp:  16 (!) 27 16  Temp:      TempSrc:      SpO2: 100% 99% 100% 99%   General: Not in acute distress HEENT:       Eyes: PERRL, EOMI, no scleral icterus.       ENT: No discharge from the ears and nose. Has moderate tongue swelling and mild lip swelling       Neck: No JVD, no bruit, no mass felt. Heme: No neck lymph node enlargement. Cardiac: K9/T2, RRR, 1/6 systolic murmurs, No gallops or rubs. No stridor. Respiratory: No rales, wheezing, rhonchi or rubs. GI: Soft, nondistended, nontender, no rebound pain, no organomegaly, BS present. GU: No hematuria Ext: has mild hand/foot swelling.  2+DP/PT pulse bilaterally. Musculoskeletal: No joint deformities, No joint redness or warmth, no limitation of ROM in spin. Skin: No rashes.  Neuro: Alert, oriented X3, cranial nerves II-XII grossly intact, moves all extremities normally.    Psych: Patient is not psychotic, no suicidal or hemocidal ideation.  Labs on Admission: I have personally reviewed following labs and imaging studies  CBC: Recent Labs  Lab 06/01/18 1445 06/01/18 2306  WBC 5.5 6.4  NEUTROABS 2.9 2.9  HGB 13.4 13.1  HCT 41.1 39.9  MCV 78.6 78.9  PLT 196 671   Basic Metabolic Panel: Recent Labs  Lab 06/01/18 1445 06/01/18 2306  NA 137 138  K 2.9* 3.0*  CL 102 102  CO2 27 28  GLUCOSE 97 145*  BUN 9 8  CREATININE 0.79 0.77  CALCIUM 9.5 9.4   GFR: CrCl cannot be calculated (Unknown ideal weight.). Liver Function Tests: Recent Labs  Lab 06/01/18 1445 06/01/18 2306  AST 18 21  ALT 14 15  ALKPHOS 80 83  BILITOT 0.5 0.5  PROT 7.3 6.9  ALBUMIN 3.6 3.4*   No results for input(s): LIPASE, AMYLASE in the last 168 hours. No results for input(s): AMMONIA in the last  168 hours. Coagulation Profile: No results for input(s): INR, PROTIME in the last 168 hours. Cardiac Enzymes: No results for input(s): CKTOTAL, CKMB, CKMBINDEX, TROPONINI in the last 168 hours. BNP (last 3 results) No results for input(s): PROBNP in the last 8760 hours. HbA1C: No results for input(s): HGBA1C in the last 72 hours. CBG: No results for input(s): GLUCAP in the last 168 hours. Lipid Profile: No results for input(s): CHOL, HDL, LDLCALC, TRIG, CHOLHDL, LDLDIRECT in the last 72 hours. Thyroid Function Tests: No results for input(s): TSH, T4TOTAL, FREET4, T3FREE, THYROIDAB in the last 72 hours. Anemia Panel: No results for input(s): VITAMINB12, FOLATE, FERRITIN, TIBC, IRON, RETICCTPCT in the last 72 hours. Urine analysis:    Component Value Date/Time   COLORURINE YELLOW 08/01/2017 1200   APPEARANCEUR CLEAR 08/01/2017 1200   LABSPEC 1.019 08/01/2017 1200   PHURINE 7.0 08/01/2017 1200   GLUCOSEU NEGATIVE 08/01/2017 1200   HGBUR NEGATIVE 08/01/2017 1200   BILIRUBINUR NEGATIVE 08/01/2017 1200   KETONESUR 5 (A) 08/01/2017 1200   PROTEINUR NEGATIVE 08/01/2017  1200   NITRITE NEGATIVE 08/01/2017 1200   LEUKOCYTESUR NEGATIVE 08/01/2017 1200   Sepsis Labs: @LABRCNTIP (procalcitonin:4,lacticidven:4) )No results found for this or any previous visit (from the past 240 hour(s)).   Radiological Exams on Admission: Dg Chest Portable 1 View  Result Date: 06/01/2018 CLINICAL DATA:  Swelling. EXAM: PORTABLE CHEST 1 VIEW COMPARISON:  August 02, 2017 FINDINGS: The heart size and mediastinal contours are within normal limits. Both lungs are clear. The visualized skeletal structures are unremarkable. IMPRESSION: No active disease. Electronically Signed   By: Dorise Bullion III M.D   On: 06/01/2018 23:24     EKG: Independently reviewed.  Sinus rhythm, QTC 434, LAE, no ischemia change.   Assessment/Plan Principal Problem:   Angioedema Active Problems:   HTN (hypertension)   Hypokalemia   GERD (gastroesophageal reflux disease)   Angioedema: Etiology is not clear.  No new medications recently.  Patient has moderate tongue swelling, mild lip swelling, difficulty swallowing, but no shortness of breath.  No stridor. Airway is protected currently.  -place on tele bed for obs -Monitor respiratory status carefully overnight  -Labs: C4, C1, C1 inhibitor level -Solu-Medrol 60 mg tid -IV Benadryl 25 mg bid -IV pepcid 20 mg bid -prn EpiPen -IVF  HTN:  -Continue home medications: Coreg, amlodipine -Hold HCTZ (giving fluid) -IV hydralazine prn  Hypokalemia: K= 2.9  on admission. - Repleted - Check Mg level - Give 1 g of magnesium sulfate'  GERD (gastroesophageal reflux disease) -on pepcid IV   DVT ppx: SQ Lovenox Code Status: Full code Family Communication:   Yes, patient's fianc   at bed side Disposition Plan:  Anticipate discharge back to previous home environment Consults called: None Admission status: Obs / tele        Date of Service 06/02/2018    Ivor Costa [facility] Pager [phone]  If 7PM-7AM, please contact  night-coverage [email or web address] Password [email or web address] 06/02/2018, 12:43 AM

## 2018-06-01 NOTE — ED Notes (Signed)
Patient verbalizes understanding of discharge instructions. Opportunity for questioning and answers were provided. Pt stated to Dr that she has taken prednisone before and it turned her hand purple. Pt not given any steroid here. I informed pt to discuss different medications with her PCP at her appointment next week. Pt discharged from ED in wheelchair. Pt states she has PCP apt scheduled on Wednesday.

## 2018-06-01 NOTE — ED Provider Notes (Signed)
[place] EMERGENCY DEPARTMENT Provider Note   CSN: [id number] Arrival date & time: 06/01/18  2230     History   Chief Complaint Chief Complaint  Patient presents with  . Allergic Reaction    HPI Connie Davis is a 52 y.o. female.  HPI  Patient is a 52yo female with PMHx of AAA s/p repair in 2017, GERD, and HTN who presents with tongue swelling that began this evening.  She c/o associated difficulty swallowing and throat tightness.  Only new medication was hydroxyzine and tramadol.  Patient states symptoms of itchy upper extremity rash and bilateral foot/hand pain has been migratory and intermittently ongoing for the last 3 days.  She attempted benadryl earlier today without relief.  No prior hx of this.  No new medications, foods, or detergents.  No known allergies.  She currently denies N/V/D, abdominal pain, CP, and SOB.    Per chart review patient earlier this morning and d/c home.  Past Medical History:  Diagnosis Date  . Abdominal aneurysm ([place])   . Blood transfusion without reported diagnosis   . GERD (gastroesophageal reflux disease)   . Hypertension   . SBO (small bowel obstruction) ([place]) 07/2017    Patient Active Problem List   Diagnosis Date Noted  . GERD (gastroesophageal reflux disease) 06/01/2018  . Angioedema 06/01/2018  . SBO (small bowel obstruction) ([place]) 08/01/2017  . HTN (hypertension) 08/01/2017  . Hypokalemia 08/01/2017  . Postoperative state 01/01/2014  . S/P hysterectomy- [facility] 01/01/2014    Past Surgical History:  Procedure Laterality Date  . ABDOMINAL AORTIC ANEURYSM REPAIR  2017  . ABDOMINAL HYSTERECTOMY    . BILATERAL SALPINGECTOMY Bilateral 01/01/2014   Procedure: BILATERAL SALPINGECTOMY;  Surgeon: Princess Bruins, MD;  Location: [facility];  Service: Gynecology;  Laterality: Bilateral;  . NO PAST SURGERIES    . ROBOTIC ASSISTED TOTAL HYSTERECTOMY N/A 01/01/2014   Procedure: ROBOTIC ASSISTED TOTAL HYSTERECTOMY;  Surgeon:  Princess Bruins, MD;  Location: [facility];  Service: Gynecology;  Laterality: N/A;     OB History   None      Home Medications    Prior to Admission medications   Medication Sig Start Date End Date Taking? Authorizing Provider  amLODipine (NORVASC) 5 MG tablet Take 5 mg by mouth daily.   Yes [provider]  aspirin EC 81 MG tablet Take 81 mg by mouth daily.    Yes [provider]  carvedilol (COREG) 25 MG tablet Take 25 mg by mouth 2 (two) times daily. 03/01/16  Yes [provider]  hydrochlorothiazide (HYDRODIURIL) 25 MG tablet Take 25 mg by mouth daily.   Yes [provider]  hydrOXYzine (ATARAX/VISTARIL) 25 MG tablet Take 1 tablet (25 mg total) by mouth every 6 (six) hours as needed for itching. 06/01/18  Yes Long, Wonda Olds, MD  pantoprazole (PROTONIX) 40 MG tablet Take 40 mg by mouth daily.   Yes [provider]  ondansetron (ZOFRAN) 4 MG tablet Take 1 tablet (4 mg total) by mouth every 6 (six) hours as needed for nausea. Patient not taking: Reported on 06/01/2018 08/04/17   Aline August, MD  polyethylene glycol (MIRALAX / GLYCOLAX) packet Take 17 g by mouth daily as needed for mild constipation. Patient not taking: Reported on 06/01/2018 08/04/17   Aline August, MD  senna (SENOKOT) 8.6 MG TABS tablet Take 1 tablet (8.6 mg total) by mouth 2 (two) times daily. Patient not taking: Reported on 06/01/2018 08/04/17   Aline August, MD  traMADol Veatrice Bourbon)  50 MG tablet Take 1 tablet (50 mg total) by mouth every 6 (six) hours as needed. 06/01/18   Long, Wonda Olds, MD    Family History Family History  Problem Relation Age of Onset  . Colon cancer Neg Hx     Social History Social History   Tobacco Use  . Smoking status: Never Smoker  . Smokeless tobacco: Never Used  Substance Use Topics  . Alcohol use: No  . Drug use: No     Allergies   Patient has no known allergies.   Review of Systems Review of Systems  Constitutional: Negative  for chills and fever.  HENT: Positive for facial swelling (tongue) and trouble swallowing. Negative for sore throat.   Eyes: Negative for pain and visual disturbance.  Respiratory: Negative for cough and shortness of breath.   Cardiovascular: Negative for chest pain and palpitations.  Gastrointestinal: Negative for abdominal pain, diarrhea, nausea and vomiting.  Genitourinary: Negative for dysuria and hematuria.  Musculoskeletal: Negative for arthralgias and back pain.  Skin: Negative for color change and rash.  Neurological: Negative for seizures and syncope.  All other systems reviewed and are negative.    Physical Exam Updated Vital Signs BP (!) 154/91   Pulse 69   Temp 97.6 F (36.4 C) (Temporal)   Resp 16   LMP 12/22/2013   SpO2 99%   Physical Exam  Constitutional: She is oriented to person, place, and time. She appears well-developed and well-nourished. No distress.  HENT:  Head: Normocephalic and atraumatic.  Moderate tongue swelling and mild lip swelling. No lip or eyelid edema.    Eyes: Pupils are equal, round, and reactive to light. Conjunctivae and EOM are normal.  Neck: Normal range of motion. Neck supple.  Cardiovascular: Normal rate, regular rhythm and intact distal pulses.  Pulmonary/Chest: Effort normal and breath sounds normal. No stridor. No respiratory distress. She has no wheezes. She has no rales.  Tolerating secretions.  Protecting airway without drooling or tripoding.  Abdominal: Soft. She exhibits no distension. There is no tenderness. There is no guarding.  Old healed abdominal scars.  Musculoskeletal: She exhibits no edema.  Neurological: She is alert and oriented to person, place, and time.  Skin: Skin is warm and dry. Capillary refill takes less than 2 seconds. No rash noted.  Psychiatric: She has a normal mood and affect.  Nursing note and vitals reviewed.    ED Treatments / Results  Labs (all labs ordered are listed, but only abnormal  results are displayed) Labs Reviewed  CBC WITH DIFFERENTIAL/PLATELET - Abnormal; Notable for the following components:      Result Value   MCH 25.9 (*)    All other components within normal limits  COMPREHENSIVE METABOLIC PANEL  C4 COMPLEMENT  C1 ESTERASE INHIBITOR  COMPLEMENT COMPONENT C1Q  MAGNESIUM  BASIC METABOLIC PANEL  CBC  RAPID URINE DRUG SCREEN, HOSP PERFORMED  I-STAT BETA HCG BLOOD, ED ([facility], [facility], [facility] ONLY)    EKG None  Radiology Dg Chest Portable 1 View  Result Date: 06/01/2018 CLINICAL DATA:  Swelling. EXAM: PORTABLE CHEST 1 VIEW COMPARISON:  August 02, 2017 FINDINGS: The heart size and mediastinal contours are within normal limits. Both lungs are clear. The visualized skeletal structures are unremarkable. IMPRESSION: No active disease. Electronically Signed   By: Dorise Bullion III M.D   On: 06/01/2018 23:24    Procedures Procedures (including critical care time)  Medications Ordered in ED Medications  potassium chloride 20 MEQ/15ML (10%) solution 60 mEq (has  no administration in time range)  potassium chloride 10 mEq in 100 mL IVPB (has no administration in time range)  famotidine (PEPCID) IVPB 20 mg premix (has no administration in time range)  0.9 %  sodium chloride infusion (has no administration in time range)  EPINEPHrine (EPI-PEN) injection 0.3 mg (has no administration in time range)  aspirin EC tablet 81 mg (has no administration in time range)  amLODipine (NORVASC) tablet 5 mg (has no administration in time range)  carvedilol (COREG) tablet 25 mg (has no administration in time range)  diphenhydrAMINE (BENADRYL) injection 25 mg (has no administration in time range)  enoxaparin (LOVENOX) injection 40 mg (has no administration in time range)  acetaminophen (TYLENOL) tablet 650 mg (has no administration in time range)    Or  acetaminophen (TYLENOL) suppository 650 mg (has no administration in time range)  ondansetron (ZOFRAN) tablet 4 mg (has no  administration in time range)    Or  ondansetron (ZOFRAN) injection 4 mg (has no administration in time range)  methylPREDNISolone sodium succinate (SOLU-MEDROL) 125 mg/2 mL injection 60 mg (has no administration in time range)  albuterol (PROVENTIL) (2.5 MG/3ML) 0.083% nebulizer solution 2.5 mg (has no administration in time range)  hydrALAZINE (APRESOLINE) injection 5 mg (has no administration in time range)  zolpidem (AMBIEN) tablet 5 mg (has no administration in time range)  magnesium sulfate IVPB 1 g 100 mL (has no administration in time range)  EPINEPHrine (EPI-PEN) injection 0.3 mg (0.3 mg Intramuscular Given 06/01/18 2240)  predniSONE (DELTASONE) tablet 60 mg (60 mg Oral Given 06/01/18 2337)  famotidine (PEPCID) tablet 20 mg (20 mg Oral Given 06/01/18 2337)     Initial Impression / Assessment and Plan / ED Course  I have reviewed the triage vital signs and the nursing notes.  Pertinent labs & imaging results that were available during my care of the patient were reviewed by me and considered in my medical decision making (see chart for details).     Patient is a 52yo female with PMHx of AAA s/p repair in 2017, GERD, and HTN who presents with tongue swelling that began this evening.  She currently denies rash, extremity pain, and GI complaints.  Of note patient seen earlier today for the same and d/c home with ultram and hydroxyzine.  No airway involvement at that time. No improvement with benadryl.  Patient has had migratory bilateral hand and foot pain over the last 3 days along with LUE pruritic rash.    On arrival patient with obvious isolated tongue swelling.  She is protecting her airway without tripoding, drooling, stridor or wheezes.  Remainder of exam unremarkable as no rashes, lungs CTAB, and abdomen benign.  She is HDS without hypotension, tachycardia, or tachypnea.  Basic labs obtained.  IM 0.3mg  epi given without improvement. Will give benadryl, prednisone, and zantac to  assess for symptomatic improvement.  Unclear etiology at this time.  Only new medications include ultram and hydroxyzine.  No ACEi, ARB, or gabapentin to explain a bradykinin mediated etiology.  Labs sent for hereditary angioedema including C4 complement, C1 esterase inhibitor, and complement component C1q as patient without pruritis or urticaria.  Will monitor for improvement or worsening.  We will not administer FFP or C1 esterase inhibitor at this time.  Question this theory as patient previously experienced pruritis and rash yesterday that resolved spontaneously.  She had no improvement of symptoms with benadryl.  Case discussed with hospitalist who will admit for further observation and work-up.  Patient verbalized  her understanding and agreement with plan.  Awaiting transport.  Final Clinical Impressions(s) / ED Diagnoses   Final diagnoses:  Tongue swelling  Allergic reaction, subsequent encounter    ED Discharge Orders    None       Fabian November, MD 06/02/18 3818    Elnora Morrison, MD 06/03/18 [phone]

## 2018-06-02 ENCOUNTER — Other Ambulatory Visit: Payer: Self-pay

## 2018-06-02 DIAGNOSIS — T783XXA Angioneurotic edema, initial encounter: Secondary | ICD-10-CM

## 2018-06-02 LAB — CBC
HEMATOCRIT: 38.9 % (ref 36.0–46.0)
Hemoglobin: 12.5 g/dL (ref 12.0–15.0)
MCH: 25.2 pg — ABNORMAL LOW (ref 26.0–34.0)
MCHC: 32.1 g/dL (ref 30.0–36.0)
MCV: 78.4 fL (ref 78.0–100.0)
Platelets: 194 10*3/uL (ref 150–400)
RBC: 4.96 MIL/uL (ref 3.87–5.11)
RDW: 14.7 % (ref 11.5–15.5)
WBC: 5.5 10*3/uL (ref 4.0–10.5)

## 2018-06-02 LAB — MAGNESIUM: MAGNESIUM: 2.1 mg/dL (ref 1.7–2.4)

## 2018-06-02 LAB — COMPREHENSIVE METABOLIC PANEL
ALT: 15 U/L (ref 0–44)
AST: 21 U/L (ref 15–41)
Albumin: 3.4 g/dL — ABNORMAL LOW (ref 3.5–5.0)
Alkaline Phosphatase: 83 U/L (ref 38–126)
Anion gap: 8 (ref 5–15)
BUN: 8 mg/dL (ref 6–20)
CHLORIDE: 102 mmol/L (ref 98–111)
CO2: 28 mmol/L (ref 22–32)
Calcium: 9.4 mg/dL (ref 8.9–10.3)
Creatinine, Ser: 0.77 mg/dL (ref 0.44–1.00)
Glucose, Bld: 145 mg/dL — ABNORMAL HIGH (ref 70–99)
POTASSIUM: 3 mmol/L — AB (ref 3.5–5.1)
SODIUM: 138 mmol/L (ref 135–145)
Total Bilirubin: 0.5 mg/dL (ref 0.3–1.2)
Total Protein: 6.9 g/dL (ref 6.5–8.1)

## 2018-06-02 LAB — RAPID URINE DRUG SCREEN, HOSP PERFORMED
Amphetamines: NOT DETECTED
BARBITURATES: NOT DETECTED
Benzodiazepines: NOT DETECTED
COCAINE: NOT DETECTED
Opiates: NOT DETECTED
TETRAHYDROCANNABINOL: NOT DETECTED

## 2018-06-02 LAB — BASIC METABOLIC PANEL
Anion gap: 8 (ref 5–15)
BUN: 7 mg/dL (ref 6–20)
CO2: 23 mmol/L (ref 22–32)
Calcium: 9.4 mg/dL (ref 8.9–10.3)
Chloride: 108 mmol/L (ref 98–111)
Creatinine, Ser: 0.76 mg/dL (ref 0.44–1.00)
Glucose, Bld: 140 mg/dL — ABNORMAL HIGH (ref 70–99)
Potassium: 3.4 mmol/L — ABNORMAL LOW (ref 3.5–5.1)
SODIUM: 139 mmol/L (ref 135–145)

## 2018-06-02 MED ORDER — METHYLPREDNISOLONE SODIUM SUCC 125 MG IJ SOLR: 60.0000 mg | Freq: Two times a day (BID) | INTRAMUSCULAR | Status: DC

## 2018-06-02 MED ORDER — ALBUTEROL SULFATE (2.5 MG/3ML) 0.083% IN NEBU: 2.5000 mg | INHALATION_SOLUTION | RESPIRATORY_TRACT | Status: DC | PRN

## 2018-06-02 MED ORDER — FAMOTIDINE IN NACL 20-0.9 MG/50ML-% IV SOLN
20.0000 mg | Freq: Two times a day (BID) | INTRAVENOUS | Status: DC
  Administered 2018-06-02: 20 mg via INTRAVENOUS
  Filled 2018-06-02: qty 50

## 2018-06-02 MED ORDER — ASPIRIN EC 81 MG PO TBEC
81.0000 mg | DELAYED_RELEASE_TABLET | Freq: Every day | ORAL | Status: DC
  Administered 2018-06-02: 81 mg via ORAL
  Filled 2018-06-02: qty 1

## 2018-06-02 MED ORDER — DIPHENHYDRAMINE HCL 25 MG PO CAPS: 25.0000 mg | ORAL_CAPSULE | Freq: Once | ORAL | Status: DC

## 2018-06-02 MED ORDER — METHYLPREDNISOLONE SODIUM SUCC 125 MG IJ SOLR
60.0000 mg | Freq: Three times a day (TID) | INTRAMUSCULAR | Status: DC
  Administered 2018-06-02 (×2): 60 mg via INTRAVENOUS
  Filled 2018-06-02 (×2): qty 2

## 2018-06-02 MED ORDER — ENOXAPARIN SODIUM 40 MG/0.4ML ~~LOC~~ SOLN
40.0000 mg | SUBCUTANEOUS | Status: DC
  Administered 2018-06-02: 40 mg via SUBCUTANEOUS
  Filled 2018-06-02: qty 0.4

## 2018-06-02 MED ORDER — EPINEPHRINE 0.3 MG/0.3ML IJ SOAJ: 0.3000 mg | INTRAMUSCULAR | 0 refills | Status: DC | PRN

## 2018-06-02 MED ORDER — ONDANSETRON HCL 4 MG PO TABS: 4.0000 mg | ORAL_TABLET | Freq: Four times a day (QID) | ORAL | Status: DC | PRN

## 2018-06-02 MED ORDER — SODIUM CHLORIDE 0.9 % IV SOLN: 25.0000 mg | Freq: Two times a day (BID) | INTRAVENOUS | Status: DC

## 2018-06-02 MED ORDER — CARVEDILOL 25 MG PO TABS
25.0000 mg | ORAL_TABLET | Freq: Two times a day (BID) | ORAL | Status: DC
  Administered 2018-06-02: 25 mg via ORAL
  Filled 2018-06-02: qty 1

## 2018-06-02 MED ORDER — METHYLPREDNISOLONE SODIUM SUCC 125 MG IJ SOLR: 60.0000 mg | Freq: Three times a day (TID) | INTRAMUSCULAR | Status: DC

## 2018-06-02 MED ORDER — DIPHENHYDRAMINE HCL 50 MG/ML IJ SOLN: 25.0000 mg | Freq: Two times a day (BID) | INTRAMUSCULAR | Status: DC | PRN

## 2018-06-02 MED ORDER — PREDNISONE 10 MG PO TABS: 40.0000 mg | ORAL_TABLET | Freq: Every day | ORAL | 0 refills | Status: AC

## 2018-06-02 MED ORDER — AMLODIPINE BESYLATE 5 MG PO TABS
5.0000 mg | ORAL_TABLET | Freq: Every day | ORAL | Status: DC
  Administered 2018-06-02: 5 mg via ORAL
  Filled 2018-06-02: qty 1

## 2018-06-02 MED ORDER — HYDRALAZINE HCL 20 MG/ML IJ SOLN: 5.0000 mg | INTRAMUSCULAR | Status: DC | PRN

## 2018-06-02 MED ORDER — ONDANSETRON HCL 4 MG/2ML IJ SOLN: 4.0000 mg | Freq: Four times a day (QID) | INTRAMUSCULAR | Status: DC | PRN

## 2018-06-02 MED ORDER — ZOLPIDEM TARTRATE 5 MG PO TABS: 5.0000 mg | ORAL_TABLET | Freq: Every evening | ORAL | Status: DC | PRN

## 2018-06-02 MED ORDER — MAGNESIUM SULFATE IN D5W 1-5 GM/100ML-% IV SOLN
1.0000 g | Freq: Once | INTRAVENOUS | Status: AC
  Administered 2018-06-02: 1 g via INTRAVENOUS
  Filled 2018-06-02: qty 100

## 2018-06-02 MED ORDER — EPINEPHRINE 0.3 MG/0.3ML IJ SOAJ
0.3000 mg | INTRAMUSCULAR | Status: DC | PRN
  Filled 2018-06-02: qty 0.3

## 2018-06-02 MED ORDER — ACETAMINOPHEN 650 MG RE SUPP: 650.0000 mg | Freq: Four times a day (QID) | RECTAL | Status: DC | PRN

## 2018-06-02 MED ORDER — SODIUM CHLORIDE 0.9 % IV SOLN
INTRAVENOUS | Status: DC
  Administered 2018-06-02 (×3): via INTRAVENOUS

## 2018-06-02 MED ORDER — ACETAMINOPHEN 325 MG PO TABS: 650.0000 mg | ORAL_TABLET | Freq: Four times a day (QID) | ORAL | Status: DC | PRN

## 2018-06-02 NOTE — Discharge Summary (Signed)
Physician Discharge Summary  Connie Davis ZCH:[id number] DOB: [birth year] DOA: 06/01/2018  PCP: [name], MD  Admit date: 06/01/2018 Discharge date: 06/02/2018  Admitted From: Home   Disposition: Home Recommendations for Outpatient Follow-up:  1. Follow up with PCP this week 2. Use your Epipen if another similar allergic reaction occurs  Written Rx for Prednisone 40 mg daily x 5 days   Home Health:   No Equipment/Devices:  None   Discharge Condition: Stable  CODE STATUS: FULL    Diet recommendation: Regular  Brief/Interim Summary:   52 y.o. female with medical history significant of AAA s/p repair in 2017, GERD, SBO, and HTN, presenting on 06/01/2018 with tongue swelling, bilateral hand/foot swelling. In review, she was experiencing  intermittent bilateral hand and foot swelling and itching for 3 days . She developed mild lip swelling on 10/5.  No shortness of breath.  Patient was seen in ED in afternoon. She was treated with EpiPen, and discharged home with hydroxyzine.  She developed  tongue swelling after she went home with throat tightening and difficulty swallowing, but no shortness of breath or stridor. No cough, fever or chills.  Denied new medications, food or detergent use. She reports possible allergy to the tape placed on her hand after a recent cyst removal.   No known allergy.  Patient does not have chest pain, nausea, vomiting, diarrhea or abdominal pain.  No symptoms of UTI. WBC was normal, K was 2.9 (replenished), HCG was negative, chemistries otherwise normal, CXR neg, Osats normal in RA . PLaced on telemetry.  The rest of her hospitalization was unremarkable.  Her swelling has significantly reduced, and she denies any complaints.  She is being discharged in stable condition.  Discharge Diagnoses:  Principal Problem:   Angioedema Active Problems:   HTN (hypertension)   Hypokalemia   GERD (gastroesophageal reflux disease)  Angioedema, significantly improved except for  mild tongue tingling.  Airway was preserved.  Inciting agent is unclear, although possibly, she may be allergic to tape. Received Solumedrol 60 mg x1 at the ED, Benadryl, Pepcid Discharged to home in stable condition, Prednisone 40 mg daily for 5 days  Benadryl prn Follow-up with PCP. Avoid regular tape use.   Hypertension BP (!) 144/87   Pulse 72   Temp 98.1 F (36.7 C) (Oral)   Resp 20   Ht 5\' 6"  (1.676 m)   Wt 69.3 kg   LMP 12/22/2013   SpO2 100%   BMI 24.66 kg/m  Continue home anti-hypertensive medications   Hypokalemia, likely due to diuretics,resolved. K on admission 2.9, replenished  Follow with PCP with labs   GERD, no acute symptoms Continue PPI   Discharge Instructions  Discharge Instructions    Call MD for:  difficulty breathing, headache or visual disturbances   Complete by:  As directed    Call MD for:  extreme fatigue   Complete by:  As directed    Call MD for:  hives   Complete by:  As directed    Call MD for:  persistant dizziness or light-headedness   Complete by:  As directed    Call MD for:  persistant nausea and vomiting   Complete by:  As directed    Call MD for:  redness, tenderness, or signs of infection (pain, swelling, redness, odor or green/yellow discharge around incision site)   Complete by:  As directed    Call MD for:  severe uncontrolled pain   Complete by:  As directed    Call  MD for:  temperature >100.4   Complete by:  As directed    Diet general   Complete by:  As directed    Discharge instructions   Complete by:  As directed    Follow up with PCP this week Prednisone 40 mg daily for the next 5 days, prescription written  Use your Epipen if another similar allergic reaction occurs   Increase activity slowly   Complete by:  As directed    No wound care   Complete by:  As directed      Allergies as of 06/02/2018   No Known Allergies     Medication List    TAKE these medications   amLODipine 5 MG tablet Commonly known  as:  NORVASC Take 5 mg by mouth daily.   aspirin EC 81 MG tablet Take 81 mg by mouth daily.   carvedilol 25 MG tablet Commonly known as:  COREG Take 25 mg by mouth 2 (two) times daily.   EPINEPHrine 0.3 mg/0.3 mL Soaj injection Commonly known as:  EPI-PEN Inject 0.3 mLs (0.3 mg total) into the muscle as needed (anaphylasix). What changed:    when to take this  reasons to take this   hydrochlorothiazide 25 MG tablet Commonly known as:  HYDRODIURIL Take 25 mg by mouth daily.   hydrOXYzine 25 MG tablet Commonly known as:  ATARAX/VISTARIL Take 1 tablet (25 mg total) by mouth every 6 (six) hours as needed for itching.   ondansetron 4 MG tablet Commonly known as:  ZOFRAN Take 1 tablet (4 mg total) by mouth every 6 (six) hours as needed for nausea.   pantoprazole 40 MG tablet Commonly known as:  PROTONIX Take 40 mg by mouth daily.   polyethylene glycol packet Commonly known as:  MIRALAX / GLYCOLAX Take 17 g by mouth daily as needed for mild constipation.   predniSONE 10 MG tablet Commonly known as:  DELTASONE Take 4 tablets (40 mg total) by mouth daily for 5 days.   senna 8.6 MG Tabs tablet Commonly known as:  SENOKOT Take 1 tablet (8.6 mg total) by mouth 2 (two) times daily.   traMADol 50 MG tablet Commonly known as:  ULTRAM Take 1 tablet (50 mg total) by mouth every 6 (six) hours as needed.      Follow-up Information    [name], MD. Schedule an appointment as soon as possible for a visit in 2 day(s).   Specialty:  Internal Medicine Why:  with CBC and BMET Contact information: [place] [id number] [phone]          No Known Allergies  Consultations: None  Procedures/Studies: Dg Chest Portable 1 View  Result Date: 06/01/2018 CLINICAL DATA:  Swelling. EXAM: PORTABLE CHEST 1 VIEW COMPARISON:  August 02, 2017 FINDINGS: The heart size and mediastinal contours are within normal limits. Both lungs are clear. The visualized  skeletal structures are unremarkable. IMPRESSION: No active disease. Electronically Signed   By: Dorise Bullion III M.D   On: 06/01/2018 23:24      Subjective: Overall status improved. Reports feeling better.No further complaints. Angioedema resolved, mild facial tingling.    Discharge Exam: Vitals:   06/02/18 0825 06/02/18 1146  BP: (!) 143/90 (!) 144/87  Pulse: 75 72  Resp: 18 20  Temp:  98.1 F (36.7 C)  SpO2:  100%   Vitals:   06/02/18 0230 06/02/18 0302 06/02/18 0825 06/02/18 1146  BP: 125/84  (!) 143/90 (!) 144/87  Pulse: 77  75  72  Resp: 15  18 20   Temp:    98.1 F (36.7 C)  TempSrc:    Oral  SpO2: 97%   100%  Weight:  69.3 kg    Height:  5\' 6"  (1.676 m)      General: Pt is alert, awake, not in acute distress Oral cavity without angioedema no lesions Cardiovascular: RRR, S1/S2 +, no rubs, no gallops Respiratory: CTA bilaterally, no wheezing, no rhonchi Abdominal: Soft, NT, ND, bowel sounds +. Well healed abdominal scar, keloids noted  Extremities: no edema, no cyanosis. L hand well heald cyst removal     The results of significant diagnostics from this hospitalization (including imaging, microbiology, ancillary and laboratory) are listed below for reference.     Microbiology: No results found for this or any previous visit (from the past 240 hour(s)).   Labs: BNP (last 3 results) No results for input(s): BNP in the last 8760 hours. Basic Metabolic Panel: Recent Labs  Lab 06/01/18 1445 06/01/18 2306 06/02/18 0010 06/02/18 0618  NA 137 138  --  139  K 2.9* 3.0*  --  3.4*  CL 102 102  --  108  CO2 27 28  --  23  GLUCOSE 97 145*  --  140*  BUN 9 8  --  7  CREATININE 0.79 0.77  --  0.76  CALCIUM 9.5 9.4  --  9.4  MG  --   --  2.1  --    Liver Function Tests: Recent Labs  Lab 06/01/18 1445 06/01/18 2306  AST 18 21  ALT 14 15  ALKPHOS 80 83  BILITOT 0.5 0.5  PROT 7.3 6.9  ALBUMIN 3.6 3.4*   No results for input(s): LIPASE, AMYLASE in the  last 168 hours. No results for input(s): AMMONIA in the last 168 hours. CBC: Recent Labs  Lab 06/01/18 1445 06/01/18 2306 06/02/18 0618  WBC 5.5 6.4 5.5  NEUTROABS 2.9 2.9  --   HGB 13.4 13.1 12.5  HCT 41.1 39.9 38.9  MCV 78.6 78.9 78.4  PLT 196 196 194   Cardiac Enzymes: No results for input(s): CKTOTAL, CKMB, CKMBINDEX, TROPONINI in the last 168 hours. BNP: Invalid input(s): POCBNP CBG: No results for input(s): GLUCAP in the last 168 hours. D-Dimer No results for input(s): DDIMER in the last 72 hours. Hgb A1c No results for input(s): HGBA1C in the last 72 hours. Lipid Profile No results for input(s): CHOL, HDL, LDLCALC, TRIG, CHOLHDL, LDLDIRECT in the last 72 hours. Thyroid function studies No results for input(s): TSH, T4TOTAL, T3FREE, THYROIDAB in the last 72 hours.  Invalid input(s): FREET3 Anemia work up No results for input(s): VITAMINB12, FOLATE, FERRITIN, TIBC, IRON, RETICCTPCT in the last 72 hours. Urinalysis    Component Value Date/Time   COLORURINE YELLOW 08/01/2017 1200   APPEARANCEUR CLEAR 08/01/2017 1200   LABSPEC 1.019 08/01/2017 1200   PHURINE 7.0 08/01/2017 1200   GLUCOSEU NEGATIVE 08/01/2017 1200   HGBUR NEGATIVE 08/01/2017 1200   BILIRUBINUR NEGATIVE 08/01/2017 1200   KETONESUR 5 (A) 08/01/2017 1200   PROTEINUR NEGATIVE 08/01/2017 1200   NITRITE NEGATIVE 08/01/2017 1200   LEUKOCYTESUR NEGATIVE 08/01/2017 1200   Sepsis Labs Invalid input(s): PROCALCITONIN,  WBC,  LACTICIDVEN Microbiology No results found for this or any previous visit (from the past 240 hour(s)).   Time coordinating discharge: Over 30 minutes  SIGNED:   Sharene Butters, MD  [facility] 06/02/2018, 2:09 PM   If 7PM-7AM, please contact night-coverage [email or web address] Password [email or web address]

## 2018-06-02 NOTE — Progress Notes (Signed)
Pt discharged via wheelchair with SWOT RN

## 2018-06-02 NOTE — Progress Notes (Signed)
Pt significant other states he left his book in the ED titled "you can do it" Personally went down to the ED, spoke to security, pod A,B, and C. No book found.  Informed pt, visitor no longer at bedside  Pt understanding

## 2018-06-02 NOTE — Progress Notes (Signed)
Pt discharge education provided at bedside. Pt has all belongings. Iv removed, cathter intact and telemetry removed. Called pt family for transportation, awaiting ride

## 2018-06-03 LAB — C1 ESTERASE INHIBITOR: C1 ESTERASE INH: 30 mg/dL (ref 21–39)

## 2018-06-04 LAB — C4 COMPLEMENT: Complement C4, Body Fluid: 67 mg/dL — ABNORMAL HIGH (ref 14–44)

## 2018-06-07 LAB — COMPLEMENT COMPONENT C1Q: C1Q COMPLEMENT PROTEIN CC1Q: 9.8 mg/dL — AB (ref 11.8–24.4)

## 2018-06-10 ENCOUNTER — Encounter (HOSPITAL_COMMUNITY): Payer: Self-pay | Admitting: *Deleted

## 2018-06-10 ENCOUNTER — Emergency Department (HOSPITAL_COMMUNITY)
Admission: EM | Admit: 2018-06-10 | Discharge: 2018-06-10 | Disposition: A | Discharge status: Home / Self Care | Payer: BLUE CROSS/BLUE SHIELD | Attending: Emergency Medicine | Admitting: Emergency Medicine

## 2018-06-10 ENCOUNTER — Other Ambulatory Visit: Payer: Self-pay

## 2018-06-10 DIAGNOSIS — T783XXD Angioneurotic edema, subsequent encounter: Secondary | ICD-10-CM

## 2018-06-10 DIAGNOSIS — R21 Rash and other nonspecific skin eruption: Secondary | ICD-10-CM | POA: Diagnosis present

## 2018-06-10 DIAGNOSIS — I10 Essential (primary) hypertension: Secondary | ICD-10-CM | POA: Diagnosis not present

## 2018-06-10 DIAGNOSIS — T783XXA Angioneurotic edema, initial encounter: Secondary | ICD-10-CM | POA: Insufficient documentation

## 2018-06-10 LAB — CBC WITH DIFFERENTIAL/PLATELET
ABS IMMATURE GRANULOCYTES: 0.02 10*3/uL (ref 0.00–0.07)
BASOS PCT: 0 %
Basophils Absolute: 0 10*3/uL (ref 0.0–0.1)
Eosinophils Absolute: 0 10*3/uL (ref 0.0–0.5)
Eosinophils Relative: 1 %
HCT: 40.2 % (ref 36.0–46.0)
Hemoglobin: 13.1 g/dL (ref 12.0–15.0)
Immature Granulocytes: 0 %
Lymphocytes Relative: 45 %
Lymphs Abs: 2.9 10*3/uL (ref 0.7–4.0)
MCH: 25.3 pg — ABNORMAL LOW (ref 26.0–34.0)
MCHC: 32.6 g/dL (ref 30.0–36.0)
MCV: 77.6 fL — AB (ref 80.0–100.0)
MONOS PCT: 6 %
Monocytes Absolute: 0.4 10*3/uL (ref 0.1–1.0)
NEUTROS ABS: 3.2 10*3/uL (ref 1.7–7.7)
NEUTROS PCT: 48 %
PLATELETS: 195 10*3/uL (ref 150–400)
RBC: 5.18 MIL/uL — AB (ref 3.87–5.11)
RDW: 15.4 % (ref 11.5–15.5)
WBC: 6.6 10*3/uL (ref 4.0–10.5)
nRBC: 0 % (ref 0.0–0.2)

## 2018-06-10 LAB — BASIC METABOLIC PANEL
Anion gap: 7 (ref 5–15)
BUN: 17 mg/dL (ref 6–20)
CO2: 28 mmol/L (ref 22–32)
Calcium: 9 mg/dL (ref 8.9–10.3)
Chloride: 104 mmol/L (ref 98–111)
Creatinine, Ser: 0.85 mg/dL (ref 0.44–1.00)
Glucose, Bld: 95 mg/dL (ref 70–99)
POTASSIUM: 3.4 mmol/L — AB (ref 3.5–5.1)
Sodium: 139 mmol/L (ref 135–145)

## 2018-06-10 MED ORDER — PREDNISONE 10 MG (21) PO TBPK: ORAL_TABLET | ORAL | 0 refills | Status: DC

## 2018-06-10 MED ORDER — METHYLPREDNISOLONE SODIUM SUCC 125 MG IJ SOLR
125.0000 mg | Freq: Once | INTRAMUSCULAR | Status: AC
  Administered 2018-06-10: 125 mg via INTRAVENOUS
  Filled 2018-06-10: qty 2

## 2018-06-10 MED ORDER — DIPHENHYDRAMINE HCL 50 MG/ML IJ SOLN
25.0000 mg | Freq: Once | INTRAMUSCULAR | Status: AC
  Administered 2018-06-10: 25 mg via INTRAVENOUS
  Filled 2018-06-10: qty 1

## 2018-06-10 MED ORDER — PREDNISONE 20 MG PO TABS: ORAL_TABLET | ORAL | 0 refills | Status: DC

## 2018-06-10 MED ORDER — FAMOTIDINE 20 MG PO TABS: 20.0000 mg | ORAL_TABLET | Freq: Two times a day (BID) | ORAL | 0 refills | Status: DC

## 2018-06-10 MED ORDER — EPINEPHRINE 0.3 MG/0.3ML IJ SOAJ
0.3000 mg | Freq: Once | INTRAMUSCULAR | Status: AC
  Administered 2018-06-10: 0.3 mg via INTRAMUSCULAR
  Filled 2018-06-10: qty 0.3

## 2018-06-10 MED ORDER — DIPHENHYDRAMINE HCL 25 MG PO TABS: 25.0000 mg | ORAL_TABLET | Freq: Four times a day (QID) | ORAL | 0 refills | Status: DC

## 2018-06-10 MED ORDER — FAMOTIDINE IN NACL 20-0.9 MG/50ML-% IV SOLN
20.0000 mg | INTRAVENOUS | Status: AC
  Administered 2018-06-10: 20 mg via INTRAVENOUS
  Filled 2018-06-10: qty 50

## 2018-06-10 MED ORDER — SODIUM CHLORIDE 0.9 % IV BOLUS
1000.0000 mL | Freq: Once | INTRAVENOUS | Status: AC
  Administered 2018-06-10: 1000 mL via INTRAVENOUS

## 2018-06-10 MED ORDER — DOXEPIN HCL 10 MG PO CAPS: 10.0000 mg | ORAL_CAPSULE | Freq: Three times a day (TID) | ORAL | 0 refills | Status: DC

## 2018-06-10 NOTE — ED Provider Notes (Signed)
[facility] EMERGENCY DEPARTMENT Provider Note   CSN: [id number] Arrival date & time: 06/10/18  0305     History   Chief Complaint Chief Complaint  Patient presents with  . Pruritis  . Allergic Reaction    HPI Connie Davis is a 52 y.o. female.  The history is provided by the patient and medical records.  Allergic Reaction  Presenting symptoms: rash      52 year old female with history of abdominal aneurysm, GERD, hypertension, hx angioedema, presenting to the ED for allergic reaction.  Patient was just admitted to the hospital on 06/01/18 for same, discharged home with prednisone.  States she finished them on Friday (2 days ago).  States yesterday she started getting recurrent symptoms of generalized itching, urticarial rash, and numb feeling on her tongue.  States today symptoms were getting worse and now her lips are started to swell again so she came back in.  She denies any new medications other than the steroids she was prescribed.  No new soaps, detergents, make-up, or foods.  No known allergies.  No fever/chills.  Patient denies any chest pain, SOB, difficulty swallowing, or sensation of throat closing at this time.  Has epi pen at home, did not use prior to arrival.  Past Medical History:  Diagnosis Date  . Abdominal aneurysm ([place])   . Blood transfusion without reported diagnosis   . GERD (gastroesophageal reflux disease)   . Hypertension   . SBO (small bowel obstruction) ([place]) 07/2017    Patient Active Problem List   Diagnosis Date Noted  . GERD (gastroesophageal reflux disease) 06/01/2018  . Angioedema 06/01/2018  . SBO (small bowel obstruction) ([place]) 08/01/2017  . HTN (hypertension) 08/01/2017  . Hypokalemia 08/01/2017  . Postoperative state 01/01/2014  . S/P hysterectomy- Davinci 01/01/2014    Past Surgical History:  Procedure Laterality Date  . ABDOMINAL AORTIC ANEURYSM REPAIR  2017  . ABDOMINAL HYSTERECTOMY    . BILATERAL  SALPINGECTOMY Bilateral 01/01/2014   Procedure: BILATERAL SALPINGECTOMY;  Surgeon: Princess Bruins, MD;  Location: [facility];  Service: Gynecology;  Laterality: Bilateral;  . NO PAST SURGERIES    . ROBOTIC ASSISTED TOTAL HYSTERECTOMY N/A 01/01/2014   Procedure: ROBOTIC ASSISTED TOTAL HYSTERECTOMY;  Surgeon: Princess Bruins, MD;  Location: [facility];  Service: Gynecology;  Laterality: N/A;     OB History   None      Home Medications    Prior to Admission medications   Medication Sig Start Date End Date Taking? Authorizing Provider  amLODipine (NORVASC) 5 MG tablet Take 5 mg by mouth daily.    [provider]  aspirin EC 81 MG tablet Take 81 mg by mouth daily.     [provider]  carvedilol (COREG) 25 MG tablet Take 25 mg by mouth 2 (two) times daily. 03/01/16   [provider]  EPINEPHrine 0.3 mg/0.3 mL IJ SOAJ injection Inject 0.3 mLs (0.3 mg total) into the muscle as needed (anaphylasix). 06/02/18   Rondel Jumbo, PA-C  hydrochlorothiazide (HYDRODIURIL) 25 MG tablet Take 25 mg by mouth daily.    [provider]  hydrOXYzine (ATARAX/VISTARIL) 25 MG tablet Take 1 tablet (25 mg total) by mouth every 6 (six) hours as needed for itching. 06/01/18   Long, Wonda Olds, MD  ondansetron (ZOFRAN) 4 MG tablet Take 1 tablet (4 mg total) by mouth every 6 (six) hours as needed for nausea. Patient not taking: Reported on 06/01/2018 08/04/17   Aline August, MD  pantoprazole (PROTONIX) 40 MG  tablet Take 40 mg by mouth daily.    [provider]  polyethylene glycol (MIRALAX / GLYCOLAX) packet Take 17 g by mouth daily as needed for mild constipation. Patient not taking: Reported on 06/01/2018 08/04/17   Aline August, MD  senna (SENOKOT) 8.6 MG TABS tablet Take 1 tablet (8.6 mg total) by mouth 2 (two) times daily. Patient not taking: Reported on 06/01/2018 08/04/17   Aline August, MD  traMADol (ULTRAM) 50 MG tablet Take 1 tablet (50 mg total) by mouth every 6 (six) hours  as needed. 06/01/18   Long, Wonda Olds, MD    Family History Family History  Problem Relation Age of Onset  . Colon cancer Neg Hx     Social History Social History   Tobacco Use  . Smoking status: Never Smoker  . Smokeless tobacco: Never Used  Substance Use Topics  . Alcohol use: No  . Drug use: No     Allergies   Patient has no known allergies.   Review of Systems Review of Systems  HENT: Positive for facial swelling.   Skin: Positive for rash.  All other systems reviewed and are negative.    Physical Exam Updated Vital Signs BP (!) 136/97 (BP Location: Right Arm)   Pulse 70   Temp 98.4 F (36.9 C) (Oral)   Resp 11   Ht 5\' 6"  (1.676 m)   Wt 68.9 kg   LMP 12/22/2013   SpO2 99%   BMI 24.53 kg/m   Physical Exam  Constitutional: She is oriented to person, place, and time. She appears well-developed and well-nourished.  Relaxed in bed, intermittently scratching but in no distress, not tripoding  HENT:  Head: Normocephalic and atraumatic.  Mouth/Throat: Oropharynx is clear and moist.  Diffuse upper lip swelling and mild swelling of upper neck; no tongue or posterior oropharyngeal edema; no drooling, normal phonation without stridor, handling secretions well  Eyes: Pupils are equal, round, and reactive to light. Conjunctivae and EOM are normal.  Neck: Normal range of motion.  Cardiovascular: Normal rate, regular rhythm and normal heart sounds.  Pulmonary/Chest: Effort normal and breath sounds normal. She has no decreased breath sounds. She has no wheezes.  Lungs clear, no distress  Abdominal: Soft. Bowel sounds are normal.  Musculoskeletal: Normal range of motion.  Neurological: She is alert and oriented to person, place, and time.  Skin: Skin is warm and dry. Rash noted. Rash is urticarial.  Urticarial rash of right volar forearm and posterior neck; signs of excoriation but no superimposed infection; no lesions on palms/soles  Psychiatric: She has a normal mood  and affect.  Nursing note and vitals reviewed.    ED Treatments / Results  Labs (all labs ordered are listed, but only abnormal results are displayed) Labs Reviewed  CBC WITH DIFFERENTIAL/PLATELET - Abnormal; Notable for the following components:      Result Value   RBC 5.18 (*)    MCV 77.6 (*)    MCH 25.3 (*)    All other components within normal limits  BASIC METABOLIC PANEL - Abnormal; Notable for the following components:   Potassium 3.4 (*)    All other components within normal limits    EKG None  Radiology No results found.  Procedures Procedures (including critical care time)  CRITICAL CARE Performed by: Larene Pickett   Total critical care time: 35 minutes  Critical care time was exclusive of separately billable procedures and treating other patients.  Critical care was necessary to treat or  prevent imminent or life-threatening deterioration.  Critical care was time spent personally by me on the following activities: development of treatment plan with patient and/or surrogate as well as nursing, discussions with consultants, evaluation of patient's response to treatment, examination of patient, obtaining history from patient or surrogate, ordering and performing treatments and interventions, ordering and review of laboratory studies, ordering and review of radiographic studies, pulse oximetry and re-evaluation of patient's condition.   Medications Ordered in ED Medications  methylPREDNISolone sodium succinate (SOLU-MEDROL) 125 mg/2 mL injection 125 mg (125 mg Intravenous Given 06/10/18 0340)  famotidine (PEPCID) IVPB 20 mg premix (20 mg Intravenous New Bag/Given 06/10/18 0419)  diphenhydrAMINE (BENADRYL) injection 25 mg (25 mg Intravenous Given 06/10/18 0341)  EPINEPHrine (EPI-PEN) injection 0.3 mg (0.3 mg Intramuscular Given 06/10/18 0347)  sodium chloride 0.9 % bolus 1,000 mL (1,000 mLs Intravenous New Bag/Given 06/10/18 0413)     Initial Impression /  Assessment and Plan / ED Course  I have reviewed the triage vital signs and the nursing notes.  Pertinent labs & imaging results that were available during my care of the patient were reviewed by me and considered in my medical decision making (see chart for details).  52 y.o. F here with allergic reaction.  Admitted 06/01/2018 for anaphylaxis.  Not currently on ACEI.  She denies any new soaps, detergents, medications, or foods.  Just finished steroids 2 days ago, symptoms returned yesterday.  Initially started with generalized itching and rash, today had onset of upper lip swelling and tingling of her tongue.  She is afebrile and nontoxic.  Does have diffuse swelling of the upper lip and mildly to the upper neck.  She has no tongue swelling, difficulty swallowing, stridor, or drooling.  She is intermittently scratching during exam but is in no acute distress, not tripoding.  Does have a urticarial rash to the posterior neck and right volar forearm.  No lesions on the palms or soles.  It is unclear what is stimulating patient's reaction.  Initially thought was due to some adhesive tape, however has not used that since previous episode.  It does not seem that there was anything new introduced recently.  She has no known history of allergies, no familial history of anaphylaxis.  Will give epi as well as Benadryl, Pepcid, Solu-Medrol.  Will need close monitoring.  5:45 AM Patient reassessed-- rash has dissipated.  No change in lip swelling.  States tongue still feels a little "tingly".  Remains without airway compromise.  Has been able to drink water without issue.  VSS.  Will continue to monitor.  6:11 AM Change of shift-- handoff given to [place].  Will continue to monitor.  If no acute change after 4 hours of monitoring, feel she can be discharged home to follow-up with allergist.  Will re-start steroids, have her continue benadryl.  She has epi-pen at home, understands indications for use.  If any acute  worsening/deterioration, she will require admission.  Case discussed with attending physician, Dr. Roxanne Mins, agrees with assessment and plan of care.  Final Clinical Impressions(s) / ED Diagnoses   Final diagnoses:  Angioedema, subsequent encounter    ED Discharge Orders         Ordered    predniSONE (DELTASONE) 20 MG tablet     06/10/18 0606    diphenhydrAMINE (BENADRYL) 25 MG tablet  Every 6 hours     06/10/18 0606           Larene Pickett, PA-C 06/10/18 [id number]  Delora Fuel, MD 30/74/60 0700

## 2018-06-10 NOTE — ED Provider Notes (Signed)
Connie Davis is a 52 y.o. female, presenting to the ED with lip swelling, rash, generalized itching, and burning in the feet.  She states symptoms started yesterday after a shower.  She did not use her EpiPen prior to arrival, but states she does have it available at home.  Upon my interview, patient continues to complain of some lip swelling and burning in the feet, but hives have improved.   HPI from Connie Carnes, PA-C: "52 year old female with history of abdominal aneurysm, GERD, hypertension, hx angioedema, presenting to the ED for allergic reaction.  Patient was just admitted to the hospital on 06/01/18 for same, discharged home with prednisone.  States she finished them on Friday (2 days ago).  States yesterday she started getting recurrent symptoms of generalized itching, urticarial rash, and numb feeling on her tongue.  States today symptoms were getting worse and now her lips are started to swell again so she came back in.  She denies any new medications other than the steroids she was prescribed.  No new soaps, detergents, make-up, or foods.  No known allergies.  No fever/chills.  Patient denies any chest pain, SOB, difficulty swallowing, or sensation of throat closing at this time."  Past Medical History:  Diagnosis Date  . Abdominal aneurysm ([place])   . Blood transfusion without reported diagnosis   . GERD (gastroesophageal reflux disease)   . Hypertension   . SBO (small bowel obstruction) ([place]) 07/2017     Physical Exam  BP (!) 136/97 (BP Location: Right Arm)   Pulse 70   Temp 98.4 F (36.9 C) (Oral)   Resp 11   Ht 5\' 6"  (1.676 m)   Wt 68.9 kg   LMP 12/22/2013   SpO2 99%   BMI 24.53 kg/m   Physical Exam  Constitutional: She appears well-developed and well-nourished. No distress.  HENT:  Head: Normocephalic and atraumatic.  Mouth/Throat: Oropharynx is clear and moist.  Edema to lips.  No noted intraoral edema.  Patient speaks in full sentences without noted difficulty.   Handling oral secretions.  Eyes: Conjunctivae are normal.  Neck: Neck supple.  Cardiovascular: Normal rate, regular rhythm, normal heart sounds and intact distal pulses.  Pulmonary/Chest: Effort normal and breath sounds normal. No respiratory distress.  No noted increased work of breathing.  Speaks in full sentences without difficulty.  Abdominal: There is no guarding.  Musculoskeletal: She exhibits no edema.  Neurological: She is alert.  Skin: Skin is warm and dry. She is not diaphoretic.  No noted rash on my assessment.  Psychiatric: She has a normal mood and affect. Her behavior is normal.  Nursing note and vitals reviewed.   ED Course/Procedures    Procedures   Abnormal Labs Reviewed  CBC WITH DIFFERENTIAL/PLATELET - Abnormal; Notable for the following components:      Result Value   RBC 5.18 (*)    MCV 77.6 (*)    MCH 25.3 (*)    All other components within normal limits  BASIC METABOLIC PANEL - Abnormal; Notable for the following components:   Potassium 3.4 (*)    All other components within normal limits    Dg Chest Portable 1 View  Result Date: 06/01/2018 CLINICAL DATA:  Swelling. EXAM: PORTABLE CHEST 1 VIEW COMPARISON:  August 02, 2017 FINDINGS: The heart size and mediastinal contours are within normal limits. Both lungs are clear. The visualized skeletal structures are unremarkable. IMPRESSION: No active disease. Electronically Signed   By: Dorise Bullion III M.D   On: 06/01/2018  23:24    MDM   Clinical Course as of Jun 10 825  Mon Jun 10, 2018  0630 Spoke with patient at length about evaluation of allergic reaction and importance of allergist follow up.  Patient symptoms have improved.  Lip edema have improved and hives have subsided.  Patient still complains of some itching and burning in the feet.   [SJ]  [id number] Patient continues to improve.  Lip edema has continued to improve.  Hives have resolved.   [SJ]    Clinical Course User Index [SJ] [name]    Took patient care handoff report from Connie Carnes, PA-C. Plan: Patient been observed following administration of epinephrine for allergic reaction.  If patient continues to show improvement, she may be discharged.  Patient showed consistent improvement throughout my time with her.  We discussed allergic reaction and goals of care at length. The patient was given instructions for home care as well as return precautions. Patient voices understanding of these instructions, accepts the plan, and is comfortable with discharge.   Findings and plan of care discussed with Delora Fuel, MD.   Vitals:   06/10/18 0615 06/10/18 0630 06/10/18 0645 06/10/18 0800  BP: (!) 135/93 140/89 134/83 140/87  Pulse: 72 71 70 66  Resp: 14 15 12    Temp:      TempSrc:      SpO2: 97% 97% 98% 97%  Weight:      Height:          Connie Bender, PA-C 44/03/47 4259    Delora Fuel, MD 56/38/75 2324

## 2018-06-10 NOTE — ED Triage Notes (Signed)
Patient reported was here 10/05 for the treated of allergic rxn, got d/ced on 06/02/18. Was prescribed prednisone for 5 days and as soon as she completed the medications symptoms of itching and generalized came back. A/O x4, not in any acute distress at the moment. Husband sitting at the bedside with her.

## 2018-06-10 NOTE — ED Provider Notes (Signed)
06/10/18 10:35 AM Spoke with Kathlee Nations from [facility] to try to get the patient in to be seen for close follow up. She states she will contact the patient and coordinate a time for her to be seen, hopefully this week.   [name], PA-C 06/10/18 1039    Lajean Saver, MD 06/10/18 1159

## 2018-06-10 NOTE — ED Notes (Signed)
Patient's swelling has improved after being medicated.

## 2018-06-10 NOTE — Discharge Instructions (Addendum)
°  Allergic Reaction Instructions:  Benadryl: Take 25 mg of Benadryl every 6 hours for the next 5 days.  Use caution as Benadryl can make you drowsy. Pepcid: Take the Pepcid, as prescribed, over the next 3 days. Prednisone: Take prednisone, as prescribed, until finished. Doxepin: This medication is meant to help with persistent hives and itching. EpiPen: You have been prescribed an EpiPen to be used in the case of anaphylaxis. Please see the attached sheets regarding the symptoms that would cause you to have to use the EpiPen. If you have to use the EpiPen, you should always come to the emergency room immediately.  Be sure to follow-up with the allergist as soon as possible on this matter.  When you call for an appointment, please be sure they know you presented to the ED twice with angioedema and hives and required intervention with epinephrine.  Return to the ED for worsening symptoms, shortness of breath, chest pain, palpitations, persistent vomiting, facial or throat swelling, or any other major concerns.

## 2018-07-01 ENCOUNTER — Ambulatory Visit (HOSPITAL_COMMUNITY)
Admission: EM | Admit: 2018-07-01 | Discharge: 2018-07-01 | Disposition: A | Discharge status: Home / Self Care | Payer: BLUE CROSS/BLUE SHIELD | Attending: Family Medicine | Admitting: Family Medicine

## 2018-07-01 ENCOUNTER — Other Ambulatory Visit: Payer: Self-pay

## 2018-07-01 ENCOUNTER — Encounter (HOSPITAL_COMMUNITY): Payer: Self-pay | Admitting: Emergency Medicine

## 2018-07-01 DIAGNOSIS — H1031 Unspecified acute conjunctivitis, right eye: Secondary | ICD-10-CM

## 2018-07-01 DIAGNOSIS — H11421 Conjunctival edema, right eye: Secondary | ICD-10-CM

## 2018-07-01 MED ORDER — POLYETHYL GLYCOL-PROPYL GLYCOL 0.4-0.3 % OP GEL: 1.0000 "application " | Freq: Every evening | OPHTHALMIC | 0 refills | Status: DC | PRN

## 2018-07-01 MED ORDER — OFLOXACIN 0.3 % OP SOLN: 1.0000 [drp] | Freq: Four times a day (QID) | OPHTHALMIC | 0 refills | Status: AC

## 2018-07-01 NOTE — ED Triage Notes (Signed)
Pt reports discomfort to the eye yesterday.  The eye was unable to open when she woke up this morning.

## 2018-07-01 NOTE — ED Provider Notes (Signed)
[facility]    CSN: [id number] Arrival date & time: 07/01/18  1143     History   Chief Complaint Chief Complaint  Patient presents with  . Conjunctivitis    right    HPI Connie Davis is a 52 y.o. female.   52 year old female comes in for 2 day history of right eye irritation.  States noticed eye discomfort after showering yesterday.  Denies foreign body, injury/trauma.  Denies vision changes.  Noticed eye drainage with crusting in the morning and mild photophobia.  Denies URI symptoms such as cough, congestion, sore throat.  Denies contact lens use, does wear reading glasses.     Past Medical History:  Diagnosis Date  . Abdominal aneurysm ([place])   . Blood transfusion without reported diagnosis   . GERD (gastroesophageal reflux disease)   . Hypertension   . SBO (small bowel obstruction) ([place]) 07/2017    Patient Active Problem List   Diagnosis Date Noted  . GERD (gastroesophageal reflux disease) 06/01/2018  . Angioedema 06/01/2018  . SBO (small bowel obstruction) ([place]) 08/01/2017  . HTN (hypertension) 08/01/2017  . Hypokalemia 08/01/2017  . Postoperative state 01/01/2014  . S/P hysterectomy- Davinci 01/01/2014    Past Surgical History:  Procedure Laterality Date  . ABDOMINAL AORTIC ANEURYSM REPAIR  2017  . ABDOMINAL HYSTERECTOMY    . BILATERAL SALPINGECTOMY Bilateral 01/01/2014   Procedure: BILATERAL SALPINGECTOMY;  Surgeon: Princess Bruins, MD;  Location: [facility];  Service: Gynecology;  Laterality: Bilateral;  . NO PAST SURGERIES    . ROBOTIC ASSISTED TOTAL HYSTERECTOMY N/A 01/01/2014   Procedure: ROBOTIC ASSISTED TOTAL HYSTERECTOMY;  Surgeon: Princess Bruins, MD;  Location: [facility];  Service: Gynecology;  Laterality: N/A;    OB History   None      Home Medications    Prior to Admission medications   Medication Sig Start Date End Date Taking? Authorizing Provider  amLODipine (NORVASC) 5 MG tablet Take 5 mg by mouth daily.   Yes [provider]  aspirin EC 81 MG tablet Take 81 mg by mouth daily.    Yes [provider]  carvedilol (COREG) 25 MG tablet Take 25 mg by mouth 2 (two) times daily. 03/01/16  Yes [provider]  diphenhydrAMINE (BENADRYL) 25 MG tablet Take 1 tablet (25 mg total) by mouth every 6 (six) hours. 06/10/18  Yes Joy, Shawn C, PA-C  folic acid (FOLVITE) 1 MG tablet Take 1 mg by mouth daily.   Yes [provider]  hydrochlorothiazide (HYDRODIURIL) 25 MG tablet Take 25 mg by mouth daily.   Yes [provider]  hydrOXYzine (ATARAX/VISTARIL) 25 MG tablet Take 1 tablet (25 mg total) by mouth every 6 (six) hours as needed for itching. 06/01/18  Yes Long, Wonda Olds, MD  pantoprazole (PROTONIX) 40 MG tablet Take 40 mg by mouth daily.   Yes [provider]  polyethylene glycol (MIRALAX / GLYCOLAX) packet Take 17 g by mouth daily as needed for mild constipation. 08/04/17  Yes Aline August, MD  potassium chloride (K-DUR,KLOR-CON) 10 MEQ tablet Take 10 mEq by mouth daily.   Yes [provider]  senna (SENOKOT) 8.6 MG TABS tablet Take 1 tablet (8.6 mg total) by mouth 2 (two) times daily. 08/04/17  Yes Aline August, MD  Vitamin D, Ergocalciferol, (DRISDOL) 50000 units CAPS capsule Take 50,000 Units by mouth every 7 (seven) days.   Yes [provider]  doxepin (SINEQUAN) 10 MG capsule Take 1 capsule (10 mg total) by mouth  3 (three) times daily for 7 days. 06/10/18 06/17/18  Joy, Shawn C, PA-C  EPINEPHrine 0.3 mg/0.3 mL IJ SOAJ injection Inject 0.3 mLs (0.3 mg total) into the muscle as needed (anaphylasix). 06/02/18   Rondel Jumbo, PA-C  famotidine (PEPCID) 20 MG tablet Take 1 tablet (20 mg total) by mouth 2 (two) times daily for 3 days. 06/10/18 06/13/18  Joy, Shawn C, PA-C  ofloxacin (OCUFLOX) 0.3 % ophthalmic solution Place 1 drop into the right eye 4 (four) times daily for 7 days. 07/01/18 07/08/18  Ok Edwards, PA-C  Polyethyl Glycol-Propyl Glycol (SYSTANE)  0.4-0.3 % GEL ophthalmic gel Place 1 application into both eyes at bedtime as needed. 07/01/18   Tasia Catchings, Elva Mauro V, PA-C  traMADol (ULTRAM) 50 MG tablet Take 1 tablet (50 mg total) by mouth every 6 (six) hours as needed. Patient not taking: Reported on 06/10/2018 06/01/18   Long, Wonda Olds, MD    Family History Family History  Problem Relation Age of Onset  . Colon cancer Neg Hx     Social History Social History   Tobacco Use  . Smoking status: Never Smoker  . Smokeless tobacco: Never Used  Substance Use Topics  . Alcohol use: No  . Drug use: No     Allergies   Patient has no known allergies.   Review of Systems Review of Systems  Reason unable to perform ROS: See HPI as above.     Physical Exam Triage Vital Signs ED Triage Vitals  Enc Vitals Group     BP 07/01/18 1252 129/88     Pulse --      Resp --      Temp 07/01/18 1252 98 F (36.7 C)     Temp Source 07/01/18 1252 Oral     SpO2 07/01/18 1252 100 %     Weight --      Height --      Head Circumference --      Peak Flow --      Pain Score 07/01/18 1253 6     Pain Loc --      Pain Edu? --      Excl. in [place]? --    No data found.  Updated Vital Signs BP 129/88 (BP Location: Left Arm)   Temp 98 F (36.7 C) (Oral)   LMP 12/22/2013   SpO2 100%   Visual Acuity Right Eye Distance:   Left Eye Distance:   Bilateral Distance:    Right Eye Near: R Near: 20/40 without corrective lens Left Eye Near:  L Near: 20/40 without corrective lens Bilateral Near:  20/40 without corrective lens  Physical Exam  Constitutional: She is oriented to person, place, and time. She appears well-developed and well-nourished. No distress.  HENT:  Head: Normocephalic and atraumatic.  Eyes: Pupils are equal, round, and reactive to light. EOM and lids are normal. Lids are everted and swept, no foreign bodies found. Right conjunctiva is injected. Left conjunctiva is not injected.  No ciliary injection.  No obvious photophobia on exam.  Mild  chemosis of the right eye.  Fluorescein stain without uptake.  Neurological: She is alert and oriented to person, place, and time.     UC Treatments / Results  Labs (all labs ordered are listed, but only abnormal results are displayed) Labs Reviewed - No data to display  EKG None  Radiology No results found.  Procedures Procedures (including critical care time)  Medications Ordered in UC Medications - No data to  display  Initial Impression / Assessment and Plan / UC Course  I have reviewed the triage vital signs and the nursing notes.  Pertinent labs & imaging results that were available during my care of the patient were reviewed by me and considered in my medical decision making (see chart for details).    Start ofloxacin drops as directed. Artificial tears gel as directed. Patient to follow up with ophthalmology if symptoms worsens or does not improve. Return precautions given.   Final Clinical Impressions(s) / UC Diagnoses   Final diagnoses:  Acute conjunctivitis of right eye, unspecified acute conjunctivitis type  Chemosis of right conjunctiva    ED Prescriptions    Medication Sig Dispense Auth. Provider   ofloxacin (OCUFLOX) 0.3 % ophthalmic solution Place 1 drop into the right eye 4 (four) times daily for 7 days. 1.4 mL Lyndi Holbein V, PA-C   Polyethyl Glycol-Propyl Glycol (SYSTANE) 0.4-0.3 % GEL ophthalmic gel Place 1 application into both eyes at bedtime as needed. 1 Bottle Tobin Chad, [place] 07/01/18 1334

## 2018-07-01 NOTE — Discharge Instructions (Signed)
Use ofloxacin eyedrops as directed on right eye. Artificial tear gel at night. Wait 10-15 minutes between drops, always use artificial tear gel last, as it prevents drops from penetrating through. As discussed, you can put both in the fridge to help with the symptoms. Monitor for any worsening of symptoms, changes in vision, sensitivity to light, eye swelling, painful eye movement, follow up with ophthalmology for further evaluation.

## 2018-07-10 ENCOUNTER — Encounter: Payer: Self-pay | Admitting: Obstetrics & Gynecology

## 2018-07-10 ENCOUNTER — Ambulatory Visit (INDEPENDENT_AMBULATORY_CARE_PROVIDER_SITE_OTHER): Payer: BLUE CROSS/BLUE SHIELD | Admitting: Obstetrics & Gynecology

## 2018-07-10 VITALS — BP 128/86 | Ht 66.0 in | Wt 158.6 lb

## 2018-07-10 DIAGNOSIS — Z780 Asymptomatic menopausal state: Secondary | ICD-10-CM

## 2018-07-10 DIAGNOSIS — Z01419 Encounter for gynecological examination (general) (routine) without abnormal findings: Secondary | ICD-10-CM

## 2018-07-10 DIAGNOSIS — Z90710 Acquired absence of both cervix and uterus: Secondary | ICD-10-CM | POA: Diagnosis not present

## 2018-07-10 NOTE — Progress Notes (Signed)
Connie Davis 08-26-1966 [id number]   History:    52 y.o. G2P2L2  Boyfriend (Abstinent until marriage)  RP:  Established patient presenting for annual gyn exam   HPI:  S/P Robotic TLH/Bilateral Salpingectomy 12/2013.  Repair of Abdominal Aortic Aneurism (AAA) 03/10/2016.  Menopause, well on no hormone replacement therapy.  No pelvic pain.  Currently abstinent.  Urine and bowel movements normal.  Breasts normal.  Health labs with family physician.  Past medical history,surgical history, family history and social history were all reviewed and documented in the EPIC chart.  Gynecologic History Patient's last menstrual period was 12/22/2013. Contraception: status post hysterectomy Last Pap: 12/2015. Results were: Negative/HPV HR negative Last mammogram: 01/2018. Results were: Negative Bone Density: Never Colonoscopy: 2018  Obstetric History OB History  Gravida Para Term Preterm AB Living  2 2       2   SAB TAB Ectopic Multiple Live Births               # Outcome Date GA Lbr Len/2nd Weight Sex Delivery Anes PTL Lv  2 Para           1 Para              ROS: A ROS was performed and pertinent positives and negatives are included in the history.  GENERAL: No fevers or chills. HEENT: No change in vision, no earache, sore throat or sinus congestion. NECK: No pain or stiffness. CARDIOVASCULAR: No chest pain or pressure. No palpitations. PULMONARY: No shortness of breath, cough or wheeze. GASTROINTESTINAL: No abdominal pain, nausea, vomiting or diarrhea, melena or bright red blood per rectum. GENITOURINARY: No urinary frequency, urgency, hesitancy or dysuria. MUSCULOSKELETAL: No joint or muscle pain, no back pain, no recent trauma. DERMATOLOGIC: No rash, no itching, no lesions. ENDOCRINE: No polyuria, polydipsia, no heat or cold intolerance. No recent change in weight. HEMATOLOGICAL: No anemia or easy bruising or bleeding. NEUROLOGIC: No headache, seizures, numbness, tingling or weakness.  PSYCHIATRIC: No depression, no loss of interest in normal activity or change in sleep pattern.     Exam:   BP 128/86   Ht 5\' 6"  (1.676 m)   Wt 158 lb 9.6 oz (71.9 kg)   LMP 12/22/2013   BMI 25.60 kg/m   Body mass index is 25.6 kg/m.  General appearance : Well developed well nourished female. No acute distress HEENT: Eyes: no retinal hemorrhage or exudates,  Neck supple, trachea midline, no carotid bruits, no thyroidmegaly Lungs: Clear to auscultation, no rhonchi or wheezes, or rib retractions  Heart: Regular rate and rhythm, no murmurs or gallops Breast:Examined in sitting and supine position were symmetrical in appearance, no palpable masses or tenderness,  no skin retraction, no nipple inversion, no nipple discharge, no skin discoloration, no axillary or supraclavicular lymphadenopathy Abdomen: no palpable masses or tenderness, no rebound or guarding Extremities: no edema or skin discoloration or tenderness  Pelvic: Vulva: Normal             Vagina: No gross lesions or discharge.  Pap reflex done  Cervix/Uterus absent  Adnexa  Without masses or tenderness  Anus: Normal   Assessment/Plan:  52 y.o. female for annual exam   1. Well female exam with routine gynecological exam Gynecologic exam status post total hysterectomy.  Pap reflex done on the vaginal vault.  Breast exam normal.  Screening mammogram was negative June 2019.  Colonoscopy done in 2018.  Health labs with family physician.  2. S/P total hysterectomy  3. Postmenopausal Well  on no hormone replacement therapy.  Recommend vitamin D supplements, calcium intake of 1.5 g/day and regular weightbearing physical activity.  Princess Bruins MD, 3:10 PM 07/10/2018

## 2018-07-12 LAB — PAP IG W/ RFLX HPV ASCU

## 2018-07-13 ENCOUNTER — Encounter: Payer: Self-pay | Admitting: Obstetrics & Gynecology

## 2018-07-13 NOTE — Patient Instructions (Signed)
1. Well female exam with routine gynecological exam Gynecologic exam status post total hysterectomy.  Pap reflex done on the vaginal vault.  Breast exam normal.  Screening mammogram was negative June 2019.  Colonoscopy done in 2018.  Health labs with family physician.  2. S/P total hysterectomy  3. Postmenopausal Well on no hormone replacement therapy.  Recommend vitamin D supplements, calcium intake of 1.5 g/day and regular weightbearing physical activity.  Connie Davis, it was a pleasure seeing you today!  I will inform you of your results as soon as they are available.

## 2019-06-24 DIAGNOSIS — I714 Abdominal aortic aneurysm, without rupture: Secondary | ICD-10-CM | POA: Diagnosis not present

## 2019-06-24 DIAGNOSIS — N280 Ischemia and infarction of kidney: Secondary | ICD-10-CM | POA: Diagnosis not present

## 2019-06-24 DIAGNOSIS — R911 Solitary pulmonary nodule: Secondary | ICD-10-CM | POA: Diagnosis not present

## 2019-06-24 DIAGNOSIS — R590 Localized enlarged lymph nodes: Secondary | ICD-10-CM | POA: Diagnosis not present

## 2019-06-24 DIAGNOSIS — R935 Abnormal findings on diagnostic imaging of other abdominal regions, including retroperitoneum: Secondary | ICD-10-CM | POA: Diagnosis not present

## 2019-06-24 DIAGNOSIS — K429 Umbilical hernia without obstruction or gangrene: Secondary | ICD-10-CM | POA: Diagnosis not present

## 2019-06-26 DIAGNOSIS — I714 Abdominal aortic aneurysm, without rupture: Secondary | ICD-10-CM | POA: Diagnosis not present

## 2019-06-26 DIAGNOSIS — R200 Anesthesia of skin: Secondary | ICD-10-CM | POA: Diagnosis not present

## 2019-06-26 DIAGNOSIS — R911 Solitary pulmonary nodule: Secondary | ICD-10-CM | POA: Diagnosis not present

## 2019-06-26 DIAGNOSIS — Z6824 Body mass index (BMI) 24.0-24.9, adult: Secondary | ICD-10-CM | POA: Diagnosis not present

## 2019-07-16 ENCOUNTER — Encounter: Payer: BLUE CROSS/BLUE SHIELD | Admitting: Obstetrics & Gynecology

## 2019-07-16 ENCOUNTER — Other Ambulatory Visit: Payer: Self-pay

## 2019-07-29 DIAGNOSIS — I714 Abdominal aortic aneurysm, without rupture: Secondary | ICD-10-CM | POA: Diagnosis not present

## 2019-07-29 DIAGNOSIS — M799 Soft tissue disorder, unspecified: Secondary | ICD-10-CM | POA: Diagnosis not present

## 2019-07-29 DIAGNOSIS — I10 Essential (primary) hypertension: Secondary | ICD-10-CM | POA: Diagnosis not present

## 2019-07-29 DIAGNOSIS — Z6824 Body mass index (BMI) 24.0-24.9, adult: Secondary | ICD-10-CM | POA: Diagnosis not present

## 2019-08-14 DIAGNOSIS — I714 Abdominal aortic aneurysm, without rupture: Secondary | ICD-10-CM | POA: Diagnosis not present

## 2019-08-14 DIAGNOSIS — M799 Soft tissue disorder, unspecified: Secondary | ICD-10-CM | POA: Diagnosis not present

## 2019-08-28 ENCOUNTER — Ambulatory Visit (INDEPENDENT_AMBULATORY_CARE_PROVIDER_SITE_OTHER): Payer: 59 | Admitting: Obstetrics & Gynecology

## 2019-08-28 ENCOUNTER — Encounter: Payer: Self-pay | Admitting: Obstetrics & Gynecology

## 2019-08-28 ENCOUNTER — Other Ambulatory Visit: Payer: Self-pay

## 2019-08-28 VITALS — BP 128/88 | Ht 65.0 in | Wt 155.0 lb

## 2019-08-28 DIAGNOSIS — Z780 Asymptomatic menopausal state: Secondary | ICD-10-CM | POA: Diagnosis not present

## 2019-08-28 DIAGNOSIS — Z01419 Encounter for gynecological examination (general) (routine) without abnormal findings: Secondary | ICD-10-CM

## 2019-08-28 DIAGNOSIS — Z90710 Acquired absence of both cervix and uterus: Secondary | ICD-10-CM | POA: Diagnosis not present

## 2019-08-28 NOTE — Progress Notes (Signed)
Connie Davis 04-Jun-1966 [id number]   History:    53 y.o. G2P2L2  Boyfriend (Abstinent until marriage)  RP:  Established patient presenting for annual gyn exam   HPI:  S/P Robotic TLH/Bilateral Salpingectomy 12/2013.  Repair of Abdominal Aortic Aneurism (AAA) 03/10/2016.  Menopause, well on no hormone replacement therapy.  No pelvic pain.  Currently abstinent. Urine and bowel movements normal.  Breasts normal.  Health labs with family physician.   Past medical history,surgical history, family history and social history were all reviewed and documented in the EPIC chart.  Gynecologic History Patient's last menstrual period was 12/22/2013.  Obstetric History OB History  Gravida Para Term Preterm AB Living  2 2       2   SAB TAB Ectopic Multiple Live Births               # Outcome Date GA Lbr Len/2nd Weight Sex Delivery Anes PTL Lv  2 Para           1 Para              ROS: A ROS was performed and pertinent positives and negatives are included in the history.  GENERAL: No fevers or chills. HEENT: No change in vision, no earache, sore throat or sinus congestion. NECK: No pain or stiffness. CARDIOVASCULAR: No chest pain or pressure. No palpitations. PULMONARY: No shortness of breath, cough or wheeze. GASTROINTESTINAL: No abdominal pain, nausea, vomiting or diarrhea, melena or bright red blood per rectum. GENITOURINARY: No urinary frequency, urgency, hesitancy or dysuria. MUSCULOSKELETAL: No joint or muscle pain, no back pain, no recent trauma. DERMATOLOGIC: No rash, no itching, no lesions. ENDOCRINE: No polyuria, polydipsia, no heat or cold intolerance. No recent change in weight. HEMATOLOGICAL: No anemia or easy bruising or bleeding. NEUROLOGIC: No headache, seizures, numbness, tingling or weakness. PSYCHIATRIC: No depression, no loss of interest in normal activity or change in sleep pattern.     Exam:   BP 128/88 (BP Location: Right Arm, Patient Position: Sitting, Cuff Size: Normal)    Ht 5\' 5"  (1.651 m)   Wt 155 lb (70.3 kg)   LMP 12/22/2013   BMI 25.79 kg/m   Body mass index is 25.79 kg/m.  General appearance : Well developed well nourished female. No acute distress HEENT: Eyes: no retinal hemorrhage or exudates,  Neck supple, trachea midline, no carotid bruits, no thyroidmegaly Lungs: Clear to auscultation, no rhonchi or wheezes, or rib retractions  Heart: Regular rate and rhythm, no murmurs or gallops Breast:Examined in sitting and supine position were symmetrical in appearance, no palpable masses or tenderness,  no skin retraction, no nipple inversion, no nipple discharge, no skin discoloration, no axillary or supraclavicular lymphadenopathy Abdomen: no palpable masses or tenderness, no rebound or guarding Extremities: no edema or skin discoloration or tenderness  Pelvic: Vulva: Normal             Vagina: No gross lesions or discharge  Cervix/Uterus absent  Adnexa  Without masses or tenderness  Anus: Normal   Assessment/Plan:  53 y.o. female for annual exam   1. Well female exam with routine gynecological exam Gynecologic exam status post total hysterectomy in menopause.  Last Pap test November 2019 was negative, no indication to repeat this year.  Breast exam normal.  Last screening mammogram in 2018 was normal, patient will schedule a screening mammogram now.  Health labs with family physician.  Good body mass index at 25.79.  Continue with healthy nutrition and fitness.  2. S/P  total hysterectomy  3. Postmenopausal Well on no hormone replacement therapy.  Recommend vitamin D supplements, calcium intake of 1200 mg daily and regular weightbearing physical activities.  Princess Bruins MD, 10:52 AM 08/28/2019

## 2019-08-31 ENCOUNTER — Encounter: Payer: Self-pay | Admitting: Obstetrics & Gynecology

## 2019-08-31 NOTE — Patient Instructions (Signed)
1. Well female exam with routine gynecological exam Gynecologic exam status post total hysterectomy in menopause.  Last Pap test November 2019 was negative, no indication to repeat this year.  Breast exam normal.  Last screening mammogram in 2018 was normal, patient will schedule a screening mammogram now.  Health labs with family physician.  Good body mass index at 25.79.  Continue with healthy nutrition and fitness.  2. S/P total hysterectomy  3. Postmenopausal Well on no hormone replacement therapy.  Recommend vitamin D supplements, calcium intake of 1200 mg daily and regular weightbearing physical activities.  Connie Davis, it was a pleasure seeing you today!

## 2019-09-17 ENCOUNTER — Other Ambulatory Visit: Payer: Self-pay | Admitting: Obstetrics & Gynecology

## 2019-09-17 DIAGNOSIS — Z1231 Encounter for screening mammogram for malignant neoplasm of breast: Secondary | ICD-10-CM

## 2019-09-19 ENCOUNTER — Other Ambulatory Visit: Payer: Self-pay

## 2019-09-19 ENCOUNTER — Ambulatory Visit
Admission: RE | Admit: 2019-09-19 | Discharge: 2019-09-19 | Disposition: A | Discharge status: Home / Self Care | Payer: 59 | Source: Ambulatory Visit | Attending: Obstetrics & Gynecology | Admitting: Obstetrics & Gynecology

## 2019-09-19 DIAGNOSIS — Z1231 Encounter for screening mammogram for malignant neoplasm of breast: Secondary | ICD-10-CM

## 2020-06-11 ENCOUNTER — Other Ambulatory Visit: Payer: Self-pay | Admitting: Internal Medicine

## 2020-06-11 DIAGNOSIS — E2839 Other primary ovarian failure: Secondary | ICD-10-CM

## 2020-09-02 ENCOUNTER — Encounter: Payer: Medicaid Other | Admitting: Obstetrics & Gynecology

## 2020-10-16 ENCOUNTER — Emergency Department (HOSPITAL_COMMUNITY)
Admission: EM | Admit: 2020-10-16 | Discharge: 2020-10-17 | Disposition: A | Discharge status: Home / Self Care | Payer: 59 | Attending: Emergency Medicine | Admitting: Emergency Medicine

## 2020-10-16 ENCOUNTER — Emergency Department (HOSPITAL_COMMUNITY): Payer: 59

## 2020-10-16 ENCOUNTER — Encounter (HOSPITAL_COMMUNITY): Payer: Self-pay | Admitting: *Deleted

## 2020-10-16 ENCOUNTER — Other Ambulatory Visit: Payer: Self-pay

## 2020-10-16 DIAGNOSIS — I10 Essential (primary) hypertension: Secondary | ICD-10-CM | POA: Insufficient documentation

## 2020-10-16 DIAGNOSIS — R079 Chest pain, unspecified: Secondary | ICD-10-CM | POA: Diagnosis present

## 2020-10-16 DIAGNOSIS — Z7982 Long term (current) use of aspirin: Secondary | ICD-10-CM | POA: Insufficient documentation

## 2020-10-16 DIAGNOSIS — Z20822 Contact with and (suspected) exposure to covid-19: Secondary | ICD-10-CM | POA: Diagnosis not present

## 2020-10-16 DIAGNOSIS — Z79899 Other long term (current) drug therapy: Secondary | ICD-10-CM | POA: Insufficient documentation

## 2020-10-16 DIAGNOSIS — R1013 Epigastric pain: Secondary | ICD-10-CM | POA: Insufficient documentation

## 2020-10-16 DIAGNOSIS — R110 Nausea: Secondary | ICD-10-CM | POA: Diagnosis not present

## 2020-10-16 DIAGNOSIS — R1011 Right upper quadrant pain: Secondary | ICD-10-CM | POA: Diagnosis not present

## 2020-10-16 LAB — TROPONIN I (HIGH SENSITIVITY)
Troponin I (High Sensitivity): 3 ng/L (ref <18)
Troponin I (High Sensitivity): 4 ng/L (ref <18)

## 2020-10-16 LAB — BASIC METABOLIC PANEL
Anion gap: 11 (ref 5–15)
BUN: 9 mg/dL (ref 6–20)
CO2: 28 mmol/L (ref 22–32)
Calcium: 9.8 mg/dL (ref 8.9–10.3)
Chloride: 101 mmol/L (ref 98–111)
Creatinine, Ser: 0.84 mg/dL (ref 0.44–1.00)
GFR, Estimated: >60 mL/min (ref >60)
Glucose, Bld: 114 mg/dL — ABNORMAL HIGH (ref 70–99)
Potassium: 2.9 mmol/L — ABNORMAL LOW (ref 3.5–5.1)
Sodium: 140 mmol/L (ref 135–145)

## 2020-10-16 LAB — CBC
HCT: 42.6 % (ref 36.0–46.0)
Hemoglobin: 13.6 g/dL (ref 12.0–15.0)
MCH: 24.3 pg — ABNORMAL LOW (ref 26.0–34.0)
MCHC: 31.9 g/dL (ref 30.0–36.0)
MCV: 76.2 fL — ABNORMAL LOW (ref 80.0–100.0)
Platelets: 258 10*3/uL (ref 150–400)
RBC: 5.59 MIL/uL — ABNORMAL HIGH (ref 3.87–5.11)
RDW: 15.9 % — ABNORMAL HIGH (ref 11.5–15.5)
WBC: 4.9 10*3/uL (ref 4.0–10.5)
nRBC: 0 % (ref 0.0–0.2)

## 2020-10-16 LAB — I-STAT BETA HCG BLOOD, ED (MC, WL, AP ONLY): I-stat hCG, quantitative: <5 m[IU]/mL (ref <5)

## 2020-10-16 LAB — LIPASE, BLOOD: Lipase: 35 U/L (ref 11–51)

## 2020-10-16 MED ORDER — PANTOPRAZOLE SODIUM 40 MG IV SOLR
40.0000 mg | Freq: Once | INTRAVENOUS | Status: AC
  Administered 2020-10-17: 40 mg via INTRAVENOUS
  Filled 2020-10-16: qty 40

## 2020-10-16 MED ORDER — MORPHINE SULFATE (PF) 4 MG/ML IV SOLN
4.0000 mg | Freq: Once | INTRAVENOUS | Status: AC
Start: 2020-10-16 — End: 2020-10-17
  Administered 2020-10-17: 4 mg via INTRAVENOUS
  Filled 2020-10-16: qty 1

## 2020-10-16 MED ORDER — POTASSIUM CHLORIDE 10 MEQ/100ML IV SOLN
10.0000 meq | INTRAVENOUS | Status: AC
Start: 2020-10-16 — End: 2020-10-17
  Administered 2020-10-17 (×4): 10 meq via INTRAVENOUS
  Filled 2020-10-16 (×4): qty 100

## 2020-10-16 MED ORDER — ONDANSETRON HCL 4 MG/2ML IJ SOLN
4.0000 mg | Freq: Once | INTRAMUSCULAR | Status: AC
  Administered 2020-10-17: 4 mg via INTRAVENOUS
  Filled 2020-10-16: qty 2

## 2020-10-16 MED ORDER — POTASSIUM CHLORIDE CRYS ER 20 MEQ PO TBCR
40.0000 meq | EXTENDED_RELEASE_TABLET | Freq: Once | ORAL | Status: DC
  Filled 2020-10-16: qty 2

## 2020-10-16 NOTE — ED Provider Notes (Signed)
[facility] EMERGENCY DEPARTMENT Provider Note   CSN: [id number] Arrival date & time: 10/16/20  1922     History Chief Complaint  Patient presents with  . Chest Pain    Connie Davis is a 55 y.o. female with past medical history significant for repair of abdominal cardiac aneurysm 03/10/2016, hypertension, SBO, H. Pylori.  HPI Patient presents emergency room today with chief complaint of sudden onset of chest pain starting at 10 AM this morning.  She states she was at work when the pain started. Pain is located in the middle of her chest and does not radiate. She rates pain  8/10 in severity.  She states the pain has been constant.  She endorses associated nausea without emesis.  She states this pain feels similar to when she had the aortic aneurysm and it feels similar to when she had peptic ulcer disease. She is also endorsing a sour taste in her mouth which she had at that time as well.  She did not take any medications for symptoms prior to arrival. She denies fever, chills, diaphoresis, syncope, palpitations, leg swelling,  diarrhea, bloody stool.   Chart review shows 05/2019 patient had follow up visit with [facility] vascular surgeon Dr. Sammuel Hines. She had CT image that showed: visceral aorta measures 38 mm which we will continue to monitor as she will likely require repair in the future. Noted plan is to follow up in 2 years with repeat CT endo chest/abdomen/pelvis with contrast.    Past Medical History:  Diagnosis Date  . Abdominal aneurysm ([place])   . Blood transfusion without reported diagnosis   . GERD (gastroesophageal reflux disease)   . Hypertension   . SBO (small bowel obstruction) ([place]) 07/2017    Patient Active Problem List   Diagnosis Date Noted  . GERD (gastroesophageal reflux disease) 06/01/2018  . Angioedema 06/01/2018  . SBO (small bowel obstruction) ([place]) 08/01/2017  . HTN (hypertension) 08/01/2017  . Hypokalemia 08/01/2017  . Postoperative state  01/01/2014  . S/P hysterectomy- [place] 01/01/2014    Past Surgical History:  Procedure Laterality Date  . ABDOMINAL AORTIC ANEURYSM REPAIR  2017  . ABDOMINAL HYSTERECTOMY    . BILATERAL SALPINGECTOMY Bilateral 01/01/2014   Procedure: BILATERAL SALPINGECTOMY;  Surgeon: Princess Bruins, MD;  Location: [facility];  Service: Gynecology;  Laterality: Bilateral;  . GANGLION CYST EXCISION  2019  . NO PAST SURGERIES    . ROBOTIC ASSISTED TOTAL HYSTERECTOMY N/A 01/01/2014   Procedure: ROBOTIC ASSISTED TOTAL HYSTERECTOMY;  Surgeon: Princess Bruins, MD;  Location: [facility];  Service: Gynecology;  Laterality: N/A;     OB History    Gravida  2   Para  2   Term      Preterm      AB      Living  2     SAB      IAB      Ectopic      Multiple      Live Births              Family History  Problem Relation Age of Onset  . Colon cancer Neg Hx     Social History   Tobacco Use  . Smoking status: Never Smoker  . Smokeless tobacco: Never Used  Vaping Use  . Vaping Use: Never used  Substance Use Topics  . Alcohol use: No  . Drug use: No    Home Medications Prior to Admission medications   Medication Sig Start Date  End Date Taking? Authorizing Provider  amitriptyline (ELAVIL) 25 MG tablet Take 25 mg by mouth at bedtime. 10/08/20  Yes [provider]  amLODipine (NORVASC) 5 MG tablet Take 5 mg by mouth daily.   Yes [provider]  aspirin EC 81 MG tablet Take 81 mg by mouth at bedtime.   Yes [provider]  carvedilol (COREG) 25 MG tablet Take 25 mg by mouth 2 (two) times daily. 03/01/16  Yes [provider]  Cholecalciferol (DIALYVITE VITAMIN D 5000) 125 MCG (5000 UT) capsule Take 5,000 Units by mouth daily.   Yes [provider]  cloNIDine (CATAPRES) 0.1 MG tablet Take 0.1 mg by mouth at bedtime. 10/01/20  Yes [provider]  EPINEPHrine 0.3 mg/0.3 mL IJ SOAJ injection Inject 0.3 mLs (0.3 mg total) into the muscle as needed  (anaphylasix). 06/02/18  Yes Rondel Jumbo, PA-C  hydrochlorothiazide (MICROZIDE) 12.5 MG capsule Take 12.5 mg by mouth daily. 09/17/20  Yes [provider]  pantoprazole (PROTONIX) 40 MG tablet Take 40 mg by mouth daily.   Yes [provider]  Polyethyl Glycol-Propyl Glycol (SYSTANE) 0.4-0.3 % GEL ophthalmic gel Place 1 application into both eyes at bedtime as needed. Patient taking differently: Place 1 application into both eyes at bedtime as needed (dry eyes). 07/01/18  Yes Yu, Amy V, PA-C  vitamin C (ASCORBIC ACID) 500 MG tablet Take 500 mg by mouth daily.   Yes [provider]  potassium chloride (K-DUR,KLOR-CON) 10 MEQ tablet Take 10 mEq by mouth daily. Patient not taking: Reported on 10/16/2020    [provider]    Allergies    Patient has no known allergies.  Review of Systems   Review of Systems All other systems are reviewed and are negative for acute change except as noted in the HPI.  Physical Exam Updated Vital Signs BP (!) 146/93 (BP Location: Left Arm)   Pulse 83   Temp 98.6 F (37 C) (Oral)   Resp 16   LMP 12/22/2013   SpO2 99%   Physical Exam Vitals and nursing note reviewed.  Constitutional:      General: She is not in acute distress.    Appearance: She is ill-appearing.     Comments: Unable to lay flat because of pain  HENT:     Head: Normocephalic and atraumatic.     Right Ear: Tympanic membrane and external ear normal.     Left Ear: Tympanic membrane and external ear normal.     Nose: Nose normal.     Mouth/Throat:     Mouth: Mucous membranes are moist.     Pharynx: Oropharynx is clear.  Eyes:     General: No scleral icterus.       Right eye: No discharge.        Left eye: No discharge.     Extraocular Movements: Extraocular movements intact.     Conjunctiva/sclera: Conjunctivae normal.     Pupils: Pupils are equal, round, and reactive to light.  Neck:     Vascular: No JVD.  Cardiovascular:     Rate and Rhythm:  Normal rate and regular rhythm.     Pulses: Normal pulses.          Radial pulses are 2+ on the right side and 2+ on the left side.       Dorsalis pedis pulses are 2+ on the right side and 2+ on the left side.     Heart sounds: Normal heart sounds.  Pulmonary:     Comments: Lungs clear to auscultation in all fields. Symmetric chest rise. No wheezing, rales, or rhonchi. Abdominal:     General: A surgical scar is present.     Comments: Abdomen is soft, non-distended. Tender to palpitation of RUQ, epigastric area, and LUQ with voluntary guarding. No rigidity. No peritoneal signs.  Musculoskeletal:        General: Normal range of motion.     Cervical back: Normal range of motion.  Skin:    General: Skin is warm and dry.     Capillary Refill: Capillary refill takes less than 2 seconds.  Neurological:     Mental Status: She is oriented to person, place, and time.     GCS: GCS eye subscore is 4. GCS verbal subscore is 5. GCS motor subscore is 6.     Comments: Fluent speech, no facial droop.  Psychiatric:        Behavior: Behavior normal.     ED Results / Procedures / Treatments   Labs (all labs ordered are listed, but only abnormal results are displayed) Labs Reviewed  BASIC METABOLIC PANEL - Abnormal; Notable for the following components:      Result Value   Potassium 2.9 (*)    Glucose, Bld 114 (*)    All other components within normal limits  CBC - Abnormal; Notable for the following components:   RBC 5.59 (*)    MCV 76.2 (*)    MCH 24.3 (*)    RDW 15.9 (*)    All other components within normal limits  RESP PANEL BY RT-PCR (FLU A&B, COVID) ARPGX2  LIPASE, BLOOD  I-STAT BETA HCG BLOOD, ED ([facility], [facility], [facility] ONLY)  TROPONIN I (HIGH SENSITIVITY)  TROPONIN I (HIGH SENSITIVITY)    EKG EKG Interpretation  Date/Time:  Saturday October 16 2020 19:31:26 EST Ventricular Rate:  95 PR Interval:  148 QRS Duration: 68 QT Interval:  350 QTC Calculation: 439 R Axis:   24 Text  Interpretation: Normal sinus rhythm no acute ST/T changes similar to Oct 2019 Confirmed by Sherwood Gambler [phone]) on 10/16/2020 10:30:19 PM   Radiology DG Chest 2 View  Result Date: 10/16/2020 CLINICAL DATA:  Upper abdominal pain and chest pain. EXAM: CHEST - 2 VIEW COMPARISON:  June 01, 2018 FINDINGS: The heart size and mediastinal contours are within normal limits. There is tortuosity of the descending thoracic aorta both lungs are clear. The visualized skeletal structures are unremarkable. IMPRESSION: No active cardiopulmonary disease. Electronically Signed   By: Virgina Norfolk M.D.   On: 10/16/2020 20:02    Procedures Procedures   Medications Ordered in ED Medications  morphine 4 MG/ML injection 4 mg (has no administration in time range)  ondansetron (ZOFRAN) injection 4 mg (has no administration in time range)  potassium chloride 10 mEq in 100 mL IVPB (has no administration in time range)  pantoprazole (PROTONIX) injection 40 mg (has no administration in time range)  potassium chloride SA (KLOR-CON) CR tablet 40 mEq (has no administration in time range)    ED Course  I have reviewed the triage vital signs and the nursing notes.  Pertinent labs & imaging results that were available during my care of the patient were reviewed by me and considered in my medical decision making (see chart for details).    MDM Rules/Calculators/A&P                          History provided by  patient with additional history obtained from chart review.    Presenting with chest pain. Has significant cardiac history.She is afebrile, HDS. BP 146/93. On my initial exam patient is uncomfortable appearing.  She has tenderness to palpation of epigastric area as well as right upper and left upper quadrants with voluntary guarding.  No peritoneal signs.  Will attempt pain control with morphine and Protonix.  Zofran and IV + PO potassium ordered as well. Lower suspicion for SBO as she had bowel movement  prior to arrival.   Labs were collected in triage.  CBC without leukocytosis, hemoglobin 13.6.  BMP shows hypokalemia with potassium of 2.9, no renal insufficiency.  Patient has history of hypokalemia and is supposed to take p.o. supplements which she has not in several months.  Lipase within normal range.  First troponin is 3. EKG shows sinus rhythm, no obvious ischemia. Patient placed on cardiac monitor. I viewed pt's chest xray and it does not suggest acute infectious processes.  CTA dissection study ordered. ED attending Dr. Regenia Skeeter evaluated patient and agrees with plan for imaging and symptom control.   Patient care transferred to L. Sanders PA-C at the end of my shift pending pain control and imaging.. Patient presentation, ED course, and plan of care discussed with review of all pertinent labs and imaging. Please see her note for further details regarding further ED course and disposition.   Final Clinical Impression(s) / ED Diagnoses Final diagnoses:  None    Rx / DC Orders ED Discharge Orders    None       Lewanda Rife 10/16/20 2357    Sherwood Gambler, MD 10/18/20 1056

## 2020-10-16 NOTE — ED Notes (Signed)
IV Team at bedside 

## 2020-10-16 NOTE — ED Provider Notes (Signed)
Assumed care from [place]-- presenting to the ED for chest pain onset 10AM. Hx of infrarenal AAA repair 2017.   Pain in middle of chest and back, nausea without vomiting.  States it felt similar to prior AAA and PUD episodes.  Labs overall reassuring, negative trops.  Given pain meds and protonix.  Plan: CTA dissection study.  Re-check pain.  Results for orders placed or performed during the hospital encounter of 10/16/20  Resp Panel by RT-PCR (Flu A&B, Covid) Nasopharyngeal Swab   Specimen: Nasopharyngeal Swab; Nasopharyngeal(NP) swabs in vial transport medium  Result Value Ref Range   SARS Coronavirus 2 by RT PCR NEGATIVE NEGATIVE   Influenza A by PCR NEGATIVE NEGATIVE   Influenza B by PCR NEGATIVE NEGATIVE  Basic metabolic panel  Result Value Ref Range   Sodium 140 135 - 145 mmol/L   Potassium 2.9 (L) 3.5 - 5.1 mmol/L   Chloride 101 98 - 111 mmol/L   CO2 28 22 - 32 mmol/L   Glucose, Bld 114 (H) 70 - 99 mg/dL   BUN 9 6 - 20 mg/dL   Creatinine, Ser 0.84 0.44 - 1.00 mg/dL   Calcium 9.8 8.9 - 10.3 mg/dL   GFR, Estimated >60 >60 mL/min   Anion gap 11 5 - 15  CBC  Result Value Ref Range   WBC 4.9 4.0 - 10.5 K/uL   RBC 5.59 (H) 3.87 - 5.11 MIL/uL   Hemoglobin 13.6 12.0 - 15.0 g/dL   HCT 42.6 36.0 - 46.0 %   MCV 76.2 (L) 80.0 - 100.0 fL   MCH 24.3 (L) 26.0 - 34.0 pg   MCHC 31.9 30.0 - 36.0 g/dL   RDW 15.9 (H) 11.5 - 15.5 %   Platelets 258 150 - 400 K/uL   nRBC 0.0 0.0 - 0.2 %  Lipase, blood  Result Value Ref Range   Lipase 35 11 - 51 U/L  I-Stat beta hCG blood, ED  Result Value Ref Range   I-stat hCG, quantitative <5.0 <5 mIU/mL   Comment 3          Troponin I (High Sensitivity)  Result Value Ref Range   Troponin I (High Sensitivity) 3 <18 ng/L  Troponin I (High Sensitivity)  Result Value Ref Range   Troponin I (High Sensitivity) 4 <18 ng/L   DG Chest 2 View  Result Date: 10/16/2020 CLINICAL DATA:  Upper abdominal pain and chest pain. EXAM: CHEST - 2 VIEW  COMPARISON:  June 01, 2018 FINDINGS: The heart size and mediastinal contours are within normal limits. There is tortuosity of the descending thoracic aorta both lungs are clear. The visualized skeletal structures are unremarkable. IMPRESSION: No active cardiopulmonary disease. Electronically Signed   By: Virgina Norfolk M.D.   On: 10/16/2020 20:02   CT Angio Chest/Abd/Pel for Dissection W and/or W/WO  Addendum Date: 10/17/2020   ADDENDUM REPORT: 10/17/2020 02:11 ADDENDUM: Finding segment should read: CTA ABDOMEN AND PELVIS FINDINGS VASCULAR Aorta: Extensive calcified noncalcified atheromatous plaque noted throughout the abdominal aorta. Multifocal fusiform dilatation of the suprarenal and juxtarenal abdominal aorta measuring up to 3.5 cm in maximal diameter, unchanged since 2017. Some stable linear transversely oriented probable webs in the suprarenal aorta are unchanged from comparison. There are postsurgical changes from distal aortoiliac open vascular repair. Stable narrowing at the proximal graft site. Distal grafts appear widely patent. Some residual infrarenal aortic dilatation is seen up to 3.5 cm in diameter, unchanged from 2018 comparison and reportedly present on outside imaging as well. Several  new contrast filled outpouchings are seen involving the previously repaired infrarenal abdominal aortic segment (7/176 for example). Electronically Signed   By: Lovena Le M.D.   On: 10/17/2020 02:11   Result Date: 10/17/2020 CLINICAL DATA:  Upper abdominal and chest pain since 4 p.m. with nausea, history of AAA EXAM: CT ANGIOGRAPHY CHEST, ABDOMEN AND PELVIS TECHNIQUE: Non-contrast CT of the chest was initially obtained. Multidetector CT imaging through the chest, abdomen and pelvis was performed using the standard protocol during bolus administration of intravenous contrast. Multiplanar reconstructed images and MIPs were obtained and reviewed to evaluate the vascular anatomy. CONTRAST:  124mL OMNIPAQUE  IOHEXOL 350 MG/ML SOLN COMPARISON:  Multiple priors, most recent imaging available 08/01/2017 CT abdomen pelvis, CT a chest 02/12/2016, outside CT angiography of the chest, abdomen and pelvis imaging report 06/24/2019 FINDINGS: CTA CHEST FINDINGS Cardiovascular: Noncontrast CT images of the chest were initially performed demonstrating atherosclerotic plaque throughout the thoracic aorta. No hyperdense mural thickening or plaque displacement to suggest acute intramural hematoma. Postcontrast administration there is satisfactory opacification of the arterial vasculature. The aortic root is suboptimally assessed given cardiac pulsation artifact. A mixture of calcified noncalcified atheromatous plaque is seen throughout the thoracic aorta. There is a bilobed aneurysmal segment of the descending thoracic aorta (11/95) with eccentric mural plaque with dilatation of the descending thoracic aorta up to 4 cm in diameter. This is increased in diameter from comparison Imaging in 2017 with this measured up to 3.5 cm maximally. Normal 3 vessel branching of the aortic arch. Minimal plaque at the right brachiocephalic origin with luminal narrowing difficult to fully assess given associated streak artifact from the contrast bolus in the adjacent venous vasculature. Normal heart size. No pericardial effusion. Central pulmonary arteries are normal caliber. No large central or lobar filling defects within limitations of this non tailored examination. No major venous abnormalities in the chest Mediastinum/Nodes: No mediastinal fluid or gas. Normal thyroid gland and thoracic inlet. No acute abnormality of the trachea or esophagus. No worrisome mediastinal, hilar or axillary adenopathy. Lungs/Pleura: Atelectatic changes noted dependently. No consolidative opacity. No convincing features of edema. No pneumothorax or effusion. No concerning pulmonary nodules or masses. Musculoskeletal: No acute or significant osseous findings. No worrisome  chest wall masses or lesions. Review of the MIP images confirms the above findings. CTA ABDOMEN AND PELVIS FINDINGS VASCULAR Aorta: Postsurgical changes from truck prior aortoiliac repair extensive calcified noncalcified atheromatous plaque noted throughout the abdominal aorta. Multifocal fusiform dilatation of the abdominal aorta which measures up to 3.5 cm in maximal diameter near the level of the renal artery origins, not significantly changed from comparison in 2017. There are postsurgical changes of the more distal abdominal aorta likely reflecting prior vascular repair of the large aneurysmal segments involving the aortic bifurcation and iliac arteries seen on comparison CT angiography. Some residual infrarenal aortic dilatation is seen up to 3.5 cm in diameter, unchanged from 2018 comparison. Stable linear transversely oriented webs versus dissection flaps as well as some focal luminal narrowing likely at the site of proximal vascular repair are also stable from prior. There are several areas of new plaque ulceration seen involving segments of the infrarenal abdominal aorta (7/176 for example). Celiac: Minimal ostial narrowing. Otherwise patent without evidence of aneurysm, dissection, vasculitis or significant stenosis. SMA: Minimal ostial narrowing. Otherwise patent without evidence of aneurysm, dissection, vasculitis or significant stenosis. Renals: Severe plaque narrowing of the right renal artery origin. Left renal artery origin is patent with early branching of a upper pole accessory  artery. IMA: IMA origin appears occluded, some minimal opacification of the more distal vessel likely supplied by back fill from left colic collaterals. Inflow: Prior surgical repair of the proximal internal iliac arteries. Vessels otherwise widely patent without new aneurysm, dissection or other acute luminal abnormality. Proximal outflow vasculature is unremarkable. Veins: No obvious venous abnormality within the  limitations of this arterial phase study. Review of the MIP images confirms the above findings. NON-VASCULAR Hepatobiliary: No worrisome focal liver lesions. Smooth liver surface contour. Normal hepatic attenuation. Gallbladder decompressed no visible calcified gallstone or significant biliary ductal dilatation. Pancreas: No pancreatic ductal dilatation or surrounding inflammatory changes. Spleen: Normal in size. No concerning splenic lesions. Adrenals/Urinary Tract: Normal adrenals. Likely sequela of prior right lower pole renal infarct, stable in appearance from comparison exam with cortical atrophy and absent enhancement. No new areas of renal cortical infarct. No other acute or suspicious renal lesions. No urolithiasis or hydronephrosis. Urinary bladder is unremarkable. Stomach/Bowel: Distal esophagus, stomach and duodenal sweep are unremarkable. No small bowel wall thickening or dilatation. No evidence of obstruction. A normal appendix is visualized. Moderate colonic stool burden. No colonic dilatation or wall thickening. Lymphatic: No suspicious or enlarged lymph nodes in the included lymphatic chains. Reproductive: Uterus is surgically absent. No concerning adnexal lesions. Other: No abdominopelvic free fluid or free gas. No bowel containing hernias. Musculoskeletal: Multilevel degenerative changes are present in the imaged portions of the spine. Additional degenerative changes in the hips and pelvis. No acute osseous abnormality or suspicious osseous lesion. Musculature is normal and symmetric. Review of the MIP images confirms the above findings. IMPRESSION: Vascular: 1. No convincing evidence of an acute aortic syndrome. 2. Bilobed aneurysmal segment of the descending thoracic aorta measuring up to 4 cm, increased in diameter from comparison imaging in 2017. Redemonstration of chronic circumferential mural thickening versus noncalcified atheromatous disease in a similar configuration to prior. No worrisome  hyperdensity is seen on the noncontrast CT imaging to suggest acute intramural hematoma. 3. Fusiform abdominal aortic dilatation involving the upper abdominal aorta as well as postsurgical changes of prior open aortoiliac vascular repair with residual juxtarenal and infrarenal aortic dilatation is seen up to 3.5 cm in diameter, overall grossly unchanged from 2018 comparison. Minimal residual narrowing of the proximal graft. Distal graft appears widely patent. Stable linear transversely oriented webs in the suprarenal aorta. 4. Small contrast filled projections seen within the repaired segment could reflect small ulcer like projections or neointimal disruption. 5. Severe plaque narrowing of the right renal artery origin and evidence of prior right lower pole devascularization/infarct. No new areas of renal cortical infarct. 6. IMA origin appears occluded, some minimal opacification of the more distal vessel likely supplied by back fill from left colic collaterals. 7. Aortic Atherosclerosis (ICD10-I70.0). Nonvascular: 1. Atelectasis without other acute cardiopulmonary abnormality. 2. Moderate colonic stool burden. Correlate for symptoms of constipation. 3. No other acute abdominopelvic abnormality aside from the vascular findings above. Electronically Signed: By: Lovena Le M.D. On: 10/17/2020 02:04   CTA with findings of post-surgical changes, increased aneurysm size of descending thoracic aorta up to 4cm now, some juxtarenal and infrarenal dilatation increased from prior but also has findings of small contrast filled projections within repaired segment which could reflect ulcer like projects or neointimal disruption, IMA occluded but does have collaters.  She is followed by vascular surgery at [facility], Dr. Sammuel Hines.  Last visit 2020.  They are watching closely as felt likely she will need repeat repair in the future.  Will discuss  findings with vascular team.  3:14 AM  Spoke with Dr. Raechel Ache fellow for vascular  surgery.  Hard to discern radiologist read given terminology used.  Feels her pain is likely too high up to be related to her graft.  Will power share images to [facility] so he can review films and will call back for plan of care.  3:49 AM Patient re-checked-- states she feels somewhat better.  She has been resting.  Now states her pain seems more like PUD.  We have discussed her CTA findings and waiting for [facility] to review.  4:53 AM Have not heart back from vascular at [facility].  Spoke with Tressie Ellis in transfer center.  Dr. Pearla Dubonnet had stated he would call transfer center back if any acute findings after he read, however none reported.  At this point, patient appears comfortable.  She is resting.  As she is localizing her pain now more towards her stomach, feel more likely this is PUD.  She is currently taking protonix, will add carafate for a few days to see if this helps.  When mentioned, states she has taken that before in the hospital and did not some relief from this.  Offered her dose here, states she does not want to put anything in her mouth right now.  She has been refusing to eat/drink here but has not had any vomiting.  VSS.  She will need OP follow-up with GI, given information for on call group to help facilitate this.  She will also need to follow-up with vascular surgery at [facility].   Larene Pickett, PA-C 10/17/20 [place], April, MD 10/17/20 [phone]

## 2020-10-16 NOTE — ED Triage Notes (Signed)
Pt states that she is having upper abdominal pain and chest pain since around 4 pm, "sharp pain". Does have nausea. No back pain, no sob. Hx of AAA, says she feels the same way. Alert and oriented.

## 2020-10-16 NOTE — ED Notes (Signed)
Attempted to start IV x2 with no success. EDP made aware. Putting in order for IV team.

## 2020-10-17 ENCOUNTER — Emergency Department (HOSPITAL_COMMUNITY): Payer: 59

## 2020-10-17 LAB — RESP PANEL BY RT-PCR (FLU A&B, COVID) ARPGX2
Influenza A by PCR: NEGATIVE
Influenza B by PCR: NEGATIVE
SARS Coronavirus 2 by RT PCR: NEGATIVE

## 2020-10-17 MED ORDER — SUCRALFATE 1 G PO TABS: 1.0000 g | ORAL_TABLET | Freq: Three times a day (TID) | ORAL | 0 refills | Status: DC

## 2020-10-17 MED ORDER — IOHEXOL 350 MG/ML SOLN
100.0000 mL | Freq: Once | INTRAVENOUS | Status: AC | PRN
  Administered 2020-10-17: 100 mL via INTRAVENOUS

## 2020-10-17 NOTE — ED Notes (Signed)
Attempted to give pt oral potassium and she states her stomach will not tolerate it at this time and she would like to hold off for a later time. Alerted primary RN.

## 2020-10-17 NOTE — Discharge Instructions (Addendum)
On your CT today, there was increased size of descending thoracic aortic aneurysm and juxtarenal/infrarenal areas.  These need to be monitored and followed up closely with your surgeon at [facility]. I would recommend that you follow-up with GI.  I have attached information for the group on call to help facilitate follow-up. Can try using carafate with your protonix for a few days to see if this helps--- sent to pharmacy for you. Try to avoid NSAIDs, spicy/acidic foods, etc. Return here for any new/acute changes.

## 2020-11-26 ENCOUNTER — Other Ambulatory Visit: Payer: Self-pay | Admitting: Internal Medicine

## 2020-11-26 DIAGNOSIS — Z1231 Encounter for screening mammogram for malignant neoplasm of breast: Secondary | ICD-10-CM

## 2021-01-19 ENCOUNTER — Ambulatory Visit: Payer: 59

## 2021-01-25 ENCOUNTER — Other Ambulatory Visit: Payer: Self-pay

## 2021-01-25 ENCOUNTER — Ambulatory Visit
Admission: RE | Admit: 2021-01-25 | Discharge: 2021-01-25 | Disposition: A | Discharge status: Home / Self Care | Payer: 59 | Source: Ambulatory Visit | Attending: Internal Medicine | Admitting: Internal Medicine

## 2021-01-25 DIAGNOSIS — Z1231 Encounter for screening mammogram for malignant neoplasm of breast: Secondary | ICD-10-CM

## 2021-08-17 ENCOUNTER — Other Ambulatory Visit: Payer: Self-pay | Admitting: Internal Medicine

## 2021-08-18 LAB — COMPLETE METABOLIC PANEL WITH GFR
AG Ratio: 1.1 (calc) (ref 1.0–2.5)
ALT: 14 U/L (ref 6–29)
AST: 15 U/L (ref 10–35)
Albumin: 4 g/dL (ref 3.6–5.1)
Alkaline phosphatase (APISO): 99 U/L (ref 37–153)
BUN: 13 mg/dL (ref 7–25)
CO2: 26 mmol/L (ref 20–32)
Calcium: 9.8 mg/dL (ref 8.6–10.4)
Chloride: 101 mmol/L (ref 98–110)
Creat: 0.77 mg/dL (ref 0.50–1.03)
Globulin: 3.5 g/dL (calc) (ref 1.9–3.7)
Glucose, Bld: 90 mg/dL (ref 65–99)
Potassium: 3.7 mmol/L (ref 3.5–5.3)
Sodium: 139 mmol/L (ref 135–146)
Total Bilirubin: 0.3 mg/dL (ref 0.2–1.2)
Total Protein: 7.5 g/dL (ref 6.1–8.1)
eGFR: 92 mL/min/{1.73_m2} (ref >=60)

## 2021-08-18 LAB — LIPID PANEL
Cholesterol: 228 mg/dL — ABNORMAL HIGH (ref <200)
HDL: 56 mg/dL (ref >=50)
LDL Cholesterol (Calc): 150 mg/dL (calc) — ABNORMAL HIGH
Non-HDL Cholesterol (Calc): 172 mg/dL (calc) — ABNORMAL HIGH (ref <130)
Total CHOL/HDL Ratio: 4.1 (calc) (ref <5.0)
Triglycerides: 102 mg/dL (ref <150)

## 2021-08-18 LAB — CBC
HCT: 42 % (ref 35.0–45.0)
Hemoglobin: 13.5 g/dL (ref 11.7–15.5)
MCH: 24.9 pg — ABNORMAL LOW (ref 27.0–33.0)
MCHC: 32.1 g/dL (ref 32.0–36.0)
MCV: 77.5 fL — ABNORMAL LOW (ref 80.0–100.0)
MPV: 12 fL (ref 7.5–12.5)
Platelets: 243 10*3/uL (ref 140–400)
RBC: 5.42 10*6/uL — ABNORMAL HIGH (ref 3.80–5.10)
RDW: 15.2 % — ABNORMAL HIGH (ref 11.0–15.0)
WBC: 4.7 10*3/uL (ref 3.8–10.8)

## 2021-08-18 LAB — VITAMIN D 25 HYDROXY (VIT D DEFICIENCY, FRACTURES): Vit D, 25-Hydroxy: 49 ng/mL (ref 30–100)

## 2021-08-18 LAB — TSH: TSH: 3.48 mIU/L

## 2021-09-12 ENCOUNTER — Other Ambulatory Visit: Payer: Self-pay

## 2021-09-12 ENCOUNTER — Ambulatory Visit (INDEPENDENT_AMBULATORY_CARE_PROVIDER_SITE_OTHER): Payer: 59 | Admitting: Obstetrics & Gynecology

## 2021-09-12 ENCOUNTER — Encounter: Payer: Self-pay | Admitting: Obstetrics & Gynecology

## 2021-09-12 ENCOUNTER — Other Ambulatory Visit (HOSPITAL_COMMUNITY)
Admission: RE | Admit: 2021-09-12 | Discharge: 2021-09-12 | Disposition: A | Discharge status: Home / Self Care | Payer: 59 | Source: Ambulatory Visit | Attending: Obstetrics & Gynecology | Admitting: Obstetrics & Gynecology

## 2021-09-12 VITALS — BP 130/84 | HR 74 | Resp 18 | Ht 67.0 in | Wt 173.0 lb

## 2021-09-12 DIAGNOSIS — Z01419 Encounter for gynecological examination (general) (routine) without abnormal findings: Secondary | ICD-10-CM

## 2021-09-12 DIAGNOSIS — Z90710 Acquired absence of both cervix and uterus: Secondary | ICD-10-CM

## 2021-09-12 DIAGNOSIS — Z1272 Encounter for screening for malignant neoplasm of vagina: Secondary | ICD-10-CM | POA: Diagnosis not present

## 2021-09-12 DIAGNOSIS — Z780 Asymptomatic menopausal state: Secondary | ICD-10-CM

## 2021-09-12 NOTE — Progress Notes (Signed)
Connie Davis 03/20/1966 [id number]   History:    56 y.o. G2P2L2  Boyfriend (Abstinent until marriage)   RP:  Established patient presenting for annual gyn exam    HPI:  S/P Robotic TLH/Bilateral Salpingectomy 12/2013.  Repair of Abdominal Aortic Aneurism (AAA) 03/10/2016.  Postmenopause, well on no hormone replacement therapy.  No pelvic pain. Currently abstinent.  Paps Neg in the past.  Last Pap 06/2018 Neg.  Urine and bowel movements normal.  Breasts normal.  Screening mammo Neg 12/2020.  BMI 27.1.  Health labs with family physician.  [place] 2018, 10 yr schedule.  Health labs with Fam MD.   Past medical history,surgical history, family history and social history were all reviewed and documented in the EPIC chart.  Gynecologic History Patient's last menstrual period was 12/22/2013.  Obstetric History OB History  Gravida Para Term Preterm AB Living  2 2       2   SAB IAB Ectopic Multiple Live Births               # Outcome Date GA Lbr Len/2nd Weight Sex Delivery Anes PTL Lv  2 Para           1 Para              ROS: A ROS was performed and pertinent positives and negatives are included in the history.  GENERAL: No fevers or chills. HEENT: No change in vision, no earache, sore throat or sinus congestion. NECK: No pain or stiffness. CARDIOVASCULAR: No chest pain or pressure. No palpitations. PULMONARY: No shortness of breath, cough or wheeze. GASTROINTESTINAL: No abdominal pain, nausea, vomiting or diarrhea, melena or bright red blood per rectum. GENITOURINARY: No urinary frequency, urgency, hesitancy or dysuria. MUSCULOSKELETAL: No joint or muscle pain, no back pain, no recent trauma. DERMATOLOGIC: No rash, no itching, no lesions. ENDOCRINE: No polyuria, polydipsia, no heat or cold intolerance. No recent change in weight. HEMATOLOGICAL: No anemia or easy bruising or bleeding. NEUROLOGIC: No headache, seizures, numbness, tingling or weakness. PSYCHIATRIC: No depression, no loss of interest  in normal activity or change in sleep pattern.     Exam:   BP 130/84 (BP Location: Right Arm, Patient Position: Sitting)    Pulse 74    Resp 18    Ht 5\' 7"  (1.702 m) Comment: declined to remove boots   Wt 173 lb (78.5 kg)    LMP 12/22/2013    SpO2 99%    BMI 27.10 kg/m   Body mass index is 27.1 kg/m.  General appearance : Well developed well nourished female. No acute distress HEENT: Eyes: no retinal hemorrhage or exudates,  Neck supple, trachea midline, no carotid bruits, no thyroidmegaly Lungs: Clear to auscultation, no rhonchi or wheezes, or rib retractions  Heart: Regular rate and rhythm, no murmurs or gallops Breast:Examined in sitting and supine position were symmetrical in appearance, no palpable masses or tenderness,  no skin retraction, no nipple inversion, no nipple discharge, no skin discoloration, no axillary or supraclavicular lymphadenopathy Abdomen: no palpable masses or tenderness, no rebound or guarding Extremities: no edema or skin discoloration or tenderness  Pelvic: Vulva: Normal             Vagina: No gross lesions or discharge  Cervix/Uterus absent  Adnexa  Without masses or tenderness  Anus: Normal   Assessment/Plan:  56 y.o. female for annual exam   1. Encounter for Papanicolaou smear of vagina as part of routine gynecological examination S/P Robotic TLH/Bilateral Salpingectomy 12/2013.  Repair of Abdominal Aortic Aneurism (AAA) 03/10/2016.  Postmenopause, well on no hormone replacement therapy.  No pelvic pain. Currently abstinent.  Paps Neg in the past.  Last Pap 06/2018 Neg.  Pap reflex done today.  Urine and bowel movements normal.  Breasts normal.  Screening mammo Neg 12/2020.  BMI 27.1.  Health labs with family physician.  [place] 2018, 10 yr schedule.  Health labs with Fam MD. - Cytology - PAP( [place])  2. S/P hysterectomy- [facility]  3. Postmenopause  S/P Robotic TLH/Bilateral Salpingectomy 12/2013.  Postmenopause, well on no hormone replacement  therapy.  No pelvic pain. Currently abstinent.  Vit D supplement, Ca++ total 1.5 g/d, regular wt bearing physical activities.  Princess Bruins MD, 11:31 AM 09/12/2021

## 2021-09-13 LAB — CYTOLOGY - PAP: Diagnosis: NEGATIVE

## 2022-02-13 ENCOUNTER — Other Ambulatory Visit: Payer: Self-pay | Admitting: Internal Medicine

## 2022-02-13 DIAGNOSIS — Z1231 Encounter for screening mammogram for malignant neoplasm of breast: Secondary | ICD-10-CM

## 2022-03-14 ENCOUNTER — Ambulatory Visit
Admission: RE | Admit: 2022-03-14 | Discharge: 2022-03-14 | Disposition: A | Discharge status: Home / Self Care | Payer: 59 | Source: Ambulatory Visit | Attending: Internal Medicine | Admitting: Internal Medicine

## 2022-03-14 DIAGNOSIS — Z1231 Encounter for screening mammogram for malignant neoplasm of breast: Secondary | ICD-10-CM

## 2022-03-15 ENCOUNTER — Ambulatory Visit: Payer: 59

## 2022-06-13 ENCOUNTER — Other Ambulatory Visit: Payer: Self-pay | Admitting: Internal Medicine

## 2022-06-14 LAB — COMPLETE METABOLIC PANEL WITH GFR
AG Ratio: 1.2 (calc) (ref 1.0–2.5)
ALT: 15 U/L (ref 6–29)
AST: 13 U/L (ref 10–35)
Albumin: 4 g/dL (ref 3.6–5.1)
Alkaline phosphatase (APISO): 99 U/L (ref 37–153)
BUN: 10 mg/dL (ref 7–25)
CO2: 28 mmol/L (ref 20–32)
Calcium: 9.6 mg/dL (ref 8.6–10.4)
Chloride: 103 mmol/L (ref 98–110)
Creat: 0.76 mg/dL (ref 0.50–1.03)
Globulin: 3.4 g/dL (calc) (ref 1.9–3.7)
Glucose, Bld: 104 mg/dL — ABNORMAL HIGH (ref 65–99)
Potassium: 3.8 mmol/L (ref 3.5–5.3)
Sodium: 140 mmol/L (ref 135–146)
Total Bilirubin: 0.4 mg/dL (ref 0.2–1.2)
Total Protein: 7.4 g/dL (ref 6.1–8.1)
eGFR: 92 mL/min/{1.73_m2} (ref >=60)

## 2022-06-14 LAB — CBC
HCT: 40.9 % (ref 35.0–45.0)
Hemoglobin: 13.2 g/dL (ref 11.7–15.5)
MCH: 25.5 pg — ABNORMAL LOW (ref 27.0–33.0)
MCHC: 32.3 g/dL (ref 32.0–36.0)
MCV: 79.1 fL — ABNORMAL LOW (ref 80.0–100.0)
MPV: 13.3 fL — ABNORMAL HIGH (ref 7.5–12.5)
Platelets: 174 10*3/uL (ref 140–400)
RBC: 5.17 10*6/uL — ABNORMAL HIGH (ref 3.80–5.10)
RDW: 14.9 % (ref 11.0–15.0)
WBC: 3.9 10*3/uL (ref 3.8–10.8)

## 2022-06-14 LAB — LIPID PANEL
Cholesterol: 213 mg/dL — ABNORMAL HIGH (ref <200)
HDL: 55 mg/dL (ref >=50)
LDL Cholesterol (Calc): 139 mg/dL (calc) — ABNORMAL HIGH
Non-HDL Cholesterol (Calc): 158 mg/dL (calc) — ABNORMAL HIGH (ref <130)
Total CHOL/HDL Ratio: 3.9 (calc) (ref <5.0)
Triglycerides: 90 mg/dL (ref <150)

## 2022-06-14 LAB — TSH: TSH: 2.43 mIU/L

## 2022-06-14 LAB — VITAMIN D 25 HYDROXY (VIT D DEFICIENCY, FRACTURES): Vit D, 25-Hydroxy: 25 ng/mL — ABNORMAL LOW (ref 30–100)

## 2022-08-24 ENCOUNTER — Encounter: Payer: Self-pay | Admitting: Podiatry

## 2022-08-24 ENCOUNTER — Ambulatory Visit: Payer: 59 | Admitting: Podiatry

## 2022-08-24 ENCOUNTER — Ambulatory Visit (INDEPENDENT_AMBULATORY_CARE_PROVIDER_SITE_OTHER): Payer: 59

## 2022-08-24 DIAGNOSIS — M2011 Hallux valgus (acquired), right foot: Secondary | ICD-10-CM | POA: Diagnosis not present

## 2022-08-24 DIAGNOSIS — M2010 Hallux valgus (acquired), unspecified foot: Secondary | ICD-10-CM

## 2022-08-24 DIAGNOSIS — M7751 Other enthesopathy of right foot: Secondary | ICD-10-CM | POA: Diagnosis not present

## 2022-08-24 MED ORDER — TRIAMCINOLONE ACETONIDE 40 MG/ML IJ SUSP
20.0000 mg | Freq: Once | INTRAMUSCULAR | Status: AC
  Administered 2022-08-24: 20 mg
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
## 2022-09-13 ENCOUNTER — Ambulatory Visit: Payer: 59 | Admitting: Obstetrics & Gynecology

## 2023-02-27 ENCOUNTER — Other Ambulatory Visit: Payer: Self-pay | Admitting: Internal Medicine

## 2023-02-27 DIAGNOSIS — Z0000 Encounter for general adult medical examination without abnormal findings: Secondary | ICD-10-CM

## 2023-04-03 ENCOUNTER — Other Ambulatory Visit: Payer: Self-pay | Admitting: Internal Medicine

## 2023-04-03 ENCOUNTER — Ambulatory Visit
Admission: RE | Admit: 2023-04-03 | Discharge: 2023-04-03 | Disposition: A | Discharge status: Home / Self Care | Payer: 59 | Source: Ambulatory Visit | Attending: Internal Medicine | Admitting: Internal Medicine

## 2023-04-03 DIAGNOSIS — Z0000 Encounter for general adult medical examination without abnormal findings: Secondary | ICD-10-CM

## 2023-06-13 ENCOUNTER — Encounter: Payer: Self-pay | Admitting: Obstetrics and Gynecology

## 2023-06-13 ENCOUNTER — Ambulatory Visit (INDEPENDENT_AMBULATORY_CARE_PROVIDER_SITE_OTHER): Payer: 59 | Admitting: Obstetrics and Gynecology

## 2023-06-13 VITALS — BP 120/80 | HR 61 | Ht 65.25 in | Wt 162.0 lb

## 2023-06-13 DIAGNOSIS — N951 Menopausal and female climacteric states: Secondary | ICD-10-CM

## 2023-06-13 DIAGNOSIS — Z01419 Encounter for gynecological examination (general) (routine) without abnormal findings: Secondary | ICD-10-CM | POA: Diagnosis not present

## 2023-06-13 DIAGNOSIS — E2839 Other primary ovarian failure: Secondary | ICD-10-CM

## 2023-06-13 MED ORDER — ESTRADIOL 0.1 MG/GM VA CREA: 1.0000 | TOPICAL_CREAM | Freq: Every day | VAGINAL | 12 refills | Status: AC

## 2023-06-13 NOTE — Progress Notes (Signed)
57 y.o. y.o. female here for annual exam.   Patient's last menstrual period was 12/22/2013.   History:    G2P2L2  not sexually active Moved here 20 years ago from [place]   RP:  Established patient presenting for annual gyn exam    HPI:  S/P Robotic TLH/Bilateral Salpingectomy 12/2013.  Repair of Abdominal Aortic Aneurism (AAA) 03/10/2016.  Postmenopause, well on no hormone replacement therapy.  No pelvic pain. Currently abstinent.  Paps Neg in the past.  Last Pap 06/2018 Neg.  Urine and bowel movements normal.  Breasts normal.  Screening mammo Neg 8/24  Body mass index is 26.75 kg/m. Health labs with family physician.  [place] 2018, 10 yr schedule.  Health labs with Fam MD. Pap smear: no history of abnormal. Can stop screening  Height 5' 5.25" (1.657 m), weight 162 lb (73.5 kg), last menstrual period 12/22/2013.     Component Value Date/Time   DIAGPAP  09/12/2021 1149    - Negative for intraepithelial lesion or malignancy (NILM)   ADEQPAP Satisfactory for evaluation. 09/12/2021 1149    GYN HISTORY:    Component Value Date/Time   DIAGPAP  09/12/2021 1149    - Negative for intraepithelial lesion or malignancy (NILM)   ADEQPAP Satisfactory for evaluation. 09/12/2021 1149    OB History  Gravida Para Term Preterm AB Living  2 2       2   SAB IAB Ectopic Multiple Live Births               # Outcome Date GA Lbr Len/2nd Weight Sex Type Anes PTL Lv  2 Para           1 Para             Past Medical History:  Diagnosis Date   Abdominal aneurysm (HCC)    Blood transfusion without reported diagnosis    GERD (gastroesophageal reflux disease)    Hypertension    SBO (small bowel obstruction) (HCC) 07/2017    Past Surgical History:  Procedure Laterality Date   ABDOMINAL AORTIC ANEURYSM REPAIR  2017   ABDOMINAL HYSTERECTOMY     BILATERAL SALPINGECTOMY Bilateral 01/01/2014   Procedure: BILATERAL SALPINGECTOMY;  Surgeon: Genia Del, MD;  Location: [facility];  Service:  Gynecology;  Laterality: Bilateral;   GANGLION CYST EXCISION  2019   NO PAST SURGERIES     ROBOTIC ASSISTED TOTAL HYSTERECTOMY N/A 01/01/2014   Procedure: ROBOTIC ASSISTED TOTAL HYSTERECTOMY;  Surgeon: Genia Del, MD;  Location: [facility];  Service: Gynecology;  Laterality: N/A;    Current Outpatient Medications on File Prior to Visit  Medication Sig Dispense Refill   amLODipine (NORVASC) 5 MG tablet Take 5 mg by mouth daily.     aspirin EC 81 MG tablet Take 81 mg by mouth at bedtime.     carvedilol (COREG) 25 MG tablet Take 25 mg by mouth 2 (two) times daily.  0   cloNIDine (CATAPRES) 0.1 MG tablet Take 0.1 mg by mouth at bedtime.     pantoprazole (PROTONIX) 40 MG tablet Take 40 mg by mouth daily.     vitamin C (ASCORBIC ACID) 500 MG tablet Take 500 mg by mouth daily.     Vitamin D, Ergocalciferol, (DRISDOL) 1.25 MG (50000 UNIT) CAPS capsule Take 50,000 Units by mouth once a week.     No current facility-administered medications on file prior to visit.    Social History   Socioeconomic History   Marital status: Single    Spouse  name: Not on file   Number of children: Not on file   Years of education: Not on file   Highest education level: Not on file  Occupational History   Not on file  Tobacco Use   Smoking status: Never   Smokeless tobacco: Never  Vaping Use   Vaping status: Never Used  Substance and Sexual Activity   Alcohol use: No   Drug use: No   Sexual activity: Not Currently    Birth control/protection: Surgical    Comment: 1st intercourse- 26, partners-1, divorced, hysterctomy  Other Topics Concern   Not on file  Social History Narrative   Not on file   Social Determinants of Health   Financial Resource Strain: Not on file  Food Insecurity: Not on file  Transportation Needs: Not on file  Physical Activity: Not on file  Stress: Not on file  Social Connections: Unknown (01/08/2022)   Received [facility]   Social Network    Social  Network: Not on file  Intimate Partner Violence: Unknown (11/30/2021)   Received from [facility]   HITS    Physically Hurt: Not on file    Insult or Talk Down To: Not on file    Threaten Physical Harm: Not on file    Scream or Curse: Not on file    Family History  Problem Relation Age of Onset   Colon cancer Neg Hx      No Known Allergies    Patient's last menstrual period was Patient's last menstrual period was 12/22/2013.Marland Kitchen            Review of Systems Alls systems reviewed and are negative.     Physical Exam Constitutional:      Appearance: Normal appearance.  Genitourinary:     Vulva normal.     No lesions in the vagina.     Right Labia: No rash, lesions or skin changes.    Left Labia: No lesions, skin changes or rash.    Vaginal cuff intact.    No vaginal discharge or tenderness.     No vaginal prolapse present.    Moderate vaginal atrophy present.     Right Adnexa: not tender and no mass present.    Left Adnexa: not tender and no mass present.    Cervix is not absent.     Uterus is not absent. Breasts:    Right: Normal.     Left: Normal.  HENT:     Head: Normocephalic.  Neck:     Thyroid: No thyroid mass, thyromegaly or thyroid tenderness.  Cardiovascular:     Rate and Rhythm: Normal rate and regular rhythm.     Heart sounds: Normal heart sounds, S1 normal and S2 normal.  Pulmonary:     Effort: Pulmonary effort is normal.     Breath sounds: Normal breath sounds and air entry.  Abdominal:     General: There is no distension.     Palpations: Abdomen is soft. There is no mass.     Tenderness: There is no abdominal tenderness. There is no guarding or rebound.  Musculoskeletal:        General: Normal range of motion.     Cervical back: Full passive range of motion without pain, normal range of motion and neck supple. No tenderness.     Right lower leg: No edema.     Left lower leg: No edema.  Neurological:     Mental Status: She is  alert.  Skin:    General: Skin is warm.  Psychiatric:        Mood and Affect: Mood normal.        Behavior: Behavior normal.        Thought Content: Thought content normal.  Vitals and nursing note reviewed. Exam conducted with a chaperone present.       A:         Well Woman GYN exam                             P:        Pap smear not indicated Encouraged annual mammogram screening Colon cancer screening up-to-date DXA ordered today Labs and immunizations to do with PMD Discussed breast self exams Encouraged healthy lifestyle practices Encouraged Vit D and Calcium  Atrophic vaginitis: to begin estrace 3 times a week  No follow-ups on file.  Earley Favor

## 2024-02-27 ENCOUNTER — Other Ambulatory Visit: Payer: Self-pay | Admitting: Internal Medicine

## 2024-02-27 DIAGNOSIS — Z1231 Encounter for screening mammogram for malignant neoplasm of breast: Secondary | ICD-10-CM

## 2024-04-03 ENCOUNTER — Ambulatory Visit
Admission: RE | Admit: 2024-04-03 | Discharge: 2024-04-03 | Disposition: A | Discharge status: Home / Self Care | Source: Ambulatory Visit | Attending: Internal Medicine | Admitting: Internal Medicine

## 2024-04-03 DIAGNOSIS — Z1231 Encounter for screening mammogram for malignant neoplasm of breast: Secondary | ICD-10-CM
